# Patient Record
Sex: Male | Born: 1940
Health system: Southern US, Community
[De-identification: ages and names within clinical notes are randomized; demographics above are authoritative.]

## PROBLEM LIST (undated history)

## (undated) DIAGNOSIS — L989 Disorder of the skin and subcutaneous tissue, unspecified: Secondary | ICD-10-CM

## (undated) DIAGNOSIS — M5430 Sciatica, unspecified side: Secondary | ICD-10-CM

## (undated) DIAGNOSIS — I10 Essential (primary) hypertension: Secondary | ICD-10-CM

## (undated) DIAGNOSIS — F419 Anxiety disorder, unspecified: Secondary | ICD-10-CM

## (undated) DIAGNOSIS — I739 Peripheral vascular disease, unspecified: Secondary | ICD-10-CM

## (undated) DIAGNOSIS — R011 Cardiac murmur, unspecified: Secondary | ICD-10-CM

## (undated) DIAGNOSIS — I6529 Occlusion and stenosis of unspecified carotid artery: Secondary | ICD-10-CM

## (undated) DIAGNOSIS — H9190 Unspecified hearing loss, unspecified ear: Secondary | ICD-10-CM

## (undated) DIAGNOSIS — K219 Gastro-esophageal reflux disease without esophagitis: Secondary | ICD-10-CM

## (undated) DIAGNOSIS — M1990 Unspecified osteoarthritis, unspecified site: Secondary | ICD-10-CM

## (undated) DIAGNOSIS — I350 Nonrheumatic aortic (valve) stenosis: Secondary | ICD-10-CM

## (undated) DIAGNOSIS — J449 Chronic obstructive pulmonary disease, unspecified: Secondary | ICD-10-CM

## (undated) DIAGNOSIS — R1900 Intra-abdominal and pelvic swelling, mass and lump, unspecified site: Secondary | ICD-10-CM

## (undated) DIAGNOSIS — L309 Dermatitis, unspecified: Secondary | ICD-10-CM

## (undated) HISTORY — DX: Unspecified hearing loss, unspecified ear: H91.90

## (undated) HISTORY — DX: Nonrheumatic aortic (valve) stenosis: I35.0

## (undated) HISTORY — DX: Occlusion and stenosis of unspecified carotid artery: I65.29

## (undated) HISTORY — DX: Chronic obstructive pulmonary disease, unspecified: J44.9

## (undated) HISTORY — DX: Cardiac murmur, unspecified: R01.1

## (undated) HISTORY — DX: Disorder of the skin and subcutaneous tissue, unspecified: L98.9

## (undated) HISTORY — DX: Sciatica, unspecified side: M54.30

## (undated) HISTORY — DX: Peripheral vascular disease, unspecified: I73.9

## (undated) HISTORY — DX: Dermatitis, unspecified: L30.9

---

## 1994-04-14 HISTORY — PX: OTHER SURGICAL HISTORY: SHX169

## 2013-03-17 ENCOUNTER — Emergency Department (HOSPITAL_COMMUNITY)
Admission: EM | Admit: 2013-03-17 | Discharge: 2013-03-17 | Disposition: A | Discharge status: Home / Self Care | Payer: Medicare Other | Attending: Emergency Medicine | Admitting: Emergency Medicine

## 2013-03-17 ENCOUNTER — Encounter (HOSPITAL_COMMUNITY): Payer: Self-pay | Admitting: Emergency Medicine

## 2013-03-17 ENCOUNTER — Emergency Department (HOSPITAL_COMMUNITY): Payer: Medicare Other

## 2013-03-17 DIAGNOSIS — M129 Arthropathy, unspecified: Secondary | ICD-10-CM | POA: Insufficient documentation

## 2013-03-17 DIAGNOSIS — W010XXA Fall on same level from slipping, tripping and stumbling without subsequent striking against object, initial encounter: Secondary | ICD-10-CM | POA: Insufficient documentation

## 2013-03-17 DIAGNOSIS — S42302A Unspecified fracture of shaft of humerus, left arm, initial encounter for closed fracture: Secondary | ICD-10-CM

## 2013-03-17 DIAGNOSIS — S42209A Unspecified fracture of upper end of unspecified humerus, initial encounter for closed fracture: Secondary | ICD-10-CM | POA: Insufficient documentation

## 2013-03-17 DIAGNOSIS — Y939 Activity, unspecified: Secondary | ICD-10-CM | POA: Insufficient documentation

## 2013-03-17 DIAGNOSIS — F17200 Nicotine dependence, unspecified, uncomplicated: Secondary | ICD-10-CM | POA: Insufficient documentation

## 2013-03-17 DIAGNOSIS — Y929 Unspecified place or not applicable: Secondary | ICD-10-CM | POA: Insufficient documentation

## 2013-03-17 DIAGNOSIS — I10 Essential (primary) hypertension: Secondary | ICD-10-CM | POA: Insufficient documentation

## 2013-03-17 DIAGNOSIS — Z8719 Personal history of other diseases of the digestive system: Secondary | ICD-10-CM | POA: Insufficient documentation

## 2013-03-17 DIAGNOSIS — Z8659 Personal history of other mental and behavioral disorders: Secondary | ICD-10-CM | POA: Insufficient documentation

## 2013-03-17 HISTORY — DX: Gastro-esophageal reflux disease without esophagitis: K21.9

## 2013-03-17 HISTORY — DX: Anxiety disorder, unspecified: F41.9

## 2013-03-17 HISTORY — DX: Unspecified osteoarthritis, unspecified site: M19.90

## 2013-03-17 HISTORY — DX: Essential (primary) hypertension: I10

## 2013-03-17 MED ORDER — OXYCODONE-ACETAMINOPHEN 5-325 MG PO TABS: 1.0000 | ORAL_TABLET | ORAL | Status: DC | PRN

## 2013-03-17 MED ORDER — ONDANSETRON HCL 4 MG PO TABS: 4.0000 mg | ORAL_TABLET | Freq: Four times a day (QID) | ORAL | Status: DC

## 2013-03-17 NOTE — ED Provider Notes (Signed)
I saw and evaluated the patient, reviewed the resident's note and I agree with the findings and plan.  EKG Interpretation   None       Pt with mechanical fall and obvious injury with ecchymosis and swelling to the left proximal humerus.  Normal elbow and wrist and N/V intact.  Sling placed and given pain meds  Gwyneth Sprout, MD 03/17/13 2136

## 2013-03-17 NOTE — ED Notes (Addendum)
Pt states he tripped on embankment and fell on his L shoulder.  Swelling noted to L shoulder.  Pt states some numbness to L upper arm.  Peripheral pulses present.  Pt also feels some R chest discomfort that increases with movement.

## 2013-03-17 NOTE — ED Notes (Signed)
Patient to xray at this time

## 2013-03-17 NOTE — ED Provider Notes (Signed)
CSN: [id number]     Arrival date & time 03/17/13  7829 History   First MD Initiated Contact with Patient 03/17/13 1845     Chief Complaint  Patient presents with  . Shoulder Pain   (Consider location/radiation/quality/duration/timing/severity/associated sxs/prior Treatment) HPI Comments: 72 year old male status Pierrette Scheu fall. Patient reports a mechanical fall falling onto the shoulder. Patient denies striking head has no other injuries outside of left shoulder pain. Patient reports left shoulder pain which is aching pain. It is moderate in severity. It is constant since injury. Injury occurred one hour prior to presentation. It is unchanged. No treatments tried so far. Denies any numbness, tingling or motor loss.  Patient is a 72 y.o. male presenting with shoulder injury.  Shoulder Injury This is a new problem. The current episode started today. The problem occurs constantly. The problem has been unchanged. Associated symptoms include arthralgias. Pertinent negatives include no abdominal pain, chest pain, fatigue, headaches, numbness, rash or weakness. Exacerbated by: movement. He has tried nothing for the symptoms.    Past Medical History  Diagnosis Date  . Hypertension   . Arthritis   . GERD (gastroesophageal reflux disease)   . Anxiety    History reviewed. No pertinent past surgical history. No family history on file. History  Substance Use Topics  . Smoking status: Current Every Day Smoker -- 1.00 packs/day  . Smokeless tobacco: Not on file  . Alcohol Use: Yes     Comment: occ    Review of Systems  Constitutional: Negative for fatigue.  Respiratory: Negative for shortness of breath.   Cardiovascular: Negative for chest pain.  Gastrointestinal: Negative for abdominal pain.  Genitourinary: Negative for dysuria. Testicular pain: left shoulder.  Musculoskeletal: Positive for arthralgias. Negative for back pain.  Skin: Negative for rash.  Neurological: Negative for weakness,  numbness and headaches.  Psychiatric/Behavioral: Negative for agitation.  All other systems reviewed and are negative.    Allergies  Codeine  Home Medications   Current Outpatient Rx  Name  Route  Sig  Dispense  Refill  . ondansetron (ZOFRAN) 4 MG tablet   Oral   Take 1 tablet (4 mg total) by mouth every 6 (six) hours.   12 tablet   0   . oxyCODONE-acetaminophen (PERCOCET/ROXICET) 5-325 MG per tablet   Oral   Take 1 tablet by mouth every 4 (four) hours as needed for severe pain.   20 tablet   0    BP 147/76  Pulse 75  Temp(Src) 98.3 F (36.8 C) (Oral)  Resp 18  SpO2 98% Physical Exam  Nursing note and vitals reviewed. Constitutional: He is oriented to person, place, and time. He appears well-developed and well-nourished.  HENT:  Head: Normocephalic and atraumatic.  No visible signs of trauma or injuries to scalp.  Eyes: EOM are normal. Pupils are equal, round, and reactive to light.  Neck: Normal range of motion.  Cardiovascular: Normal rate, regular rhythm and intact distal pulses.   Pulmonary/Chest: Effort normal and breath sounds normal. No respiratory distress. He exhibits no tenderness.  Abdominal: Soft. He exhibits no distension. There is no tenderness. There is no rebound and no guarding.  Musculoskeletal: Normal range of motion.  Patient with severe tenderness to manipulation of left shoulder. Neurovascularly intact distal to injury. No visible deformities.  Neurological: He is alert and oriented to person, place, and time. No cranial nerve deficit. He exhibits normal muscle tone. Coordination normal.  Skin: Skin is warm and dry. No rash noted.  Psychiatric: He  has a normal mood and affect. His behavior is normal. Judgment and thought content normal.    ED Course  Procedures (including critical care time) Labs Review Labs Reviewed - No data to display Imaging Review Dg Chest 2 View  03/17/2013   CLINICAL DATA:  Fall.  Left chest pain.  Left humerus  fracture.  EXAM: CHEST  2 VIEW  COMPARISON:  03/21/2010  FINDINGS: Heart size remains within normal limits. Mild ectasia of thoracic aorta is stable. Both lungs are clear. No evidence of pneumothorax or hemothorax. No mass or lymphadenopathy identified. Thoracic spine degenerative changes noted.  IMPRESSION: No active disease.   Electronically Signed   By: Myles Rosenthal M.D.   On: 03/17/2013 19:19   Dg Shoulder Left  03/17/2013   CLINICAL DATA:  Status Brien Lowe fall with shoulder pain  EXAM: LEFT SHOULDER - 2+ VIEW  COMPARISON:  None  FINDINGS: There is comminuted displaced fracture of the left humeral head and neck. The visualized left ribs and left lung are normal.  IMPRESSION: Fracture proximal left humerus.   Electronically Signed   By: Sherian Rein M.D.   On: 03/17/2013 19:17    EKG Interpretation   None       MDM   1. Humeral fracture, left, closed, initial encounter    Afebrile vital signs stable on arrival. Patient was a mechanical fall onto left shoulder. Patient denies any syncope, chest pain, shortness of breath et Karie Soda. Strictly mechanical fall. On thorough examination patient with tenderness palpation only over left shoulder. Patient is neurovascularly intact distal to injury. No other injuries noted. X-ray of left shoulder shows a comminuted displaced fracture left humeral head and neck. Patient was placed in a shoulder sling/immobilizer. Contact orthopedics who agrees outpatient management and followup in 4 days appropriate. Patient given a small prescription for pain medication. Given return precautions for worsening pain numbness, tingling etc. patient voiced understanding. No further acute issues until discharge.    Bridgett Larsson, MD 03/17/13 [phone]

## 2013-03-22 ENCOUNTER — Other Ambulatory Visit: Payer: Self-pay | Admitting: Sports Medicine

## 2013-03-22 ENCOUNTER — Ambulatory Visit
Admission: RE | Admit: 2013-03-22 | Discharge: 2013-03-22 | Disposition: A | Discharge status: Home / Self Care | Payer: Medicare Other | Source: Ambulatory Visit | Attending: Sports Medicine | Admitting: Sports Medicine

## 2013-03-22 DIAGNOSIS — S42309A Unspecified fracture of shaft of humerus, unspecified arm, initial encounter for closed fracture: Secondary | ICD-10-CM

## 2013-12-20 ENCOUNTER — Other Ambulatory Visit: Payer: Self-pay | Admitting: Family Medicine

## 2013-12-20 DIAGNOSIS — R1900 Intra-abdominal and pelvic swelling, mass and lump, unspecified site: Secondary | ICD-10-CM

## 2014-01-12 ENCOUNTER — Encounter: Payer: Self-pay | Admitting: General Surgery

## 2014-01-12 DIAGNOSIS — E785 Hyperlipidemia, unspecified: Secondary | ICD-10-CM | POA: Insufficient documentation

## 2014-01-12 DIAGNOSIS — E7800 Pure hypercholesterolemia, unspecified: Secondary | ICD-10-CM

## 2014-01-12 DIAGNOSIS — I10 Essential (primary) hypertension: Secondary | ICD-10-CM

## 2014-01-12 DIAGNOSIS — I359 Nonrheumatic aortic valve disorder, unspecified: Secondary | ICD-10-CM

## 2014-01-17 ENCOUNTER — Ambulatory Visit
Admission: RE | Admit: 2014-01-17 | Discharge: 2014-01-17 | Disposition: A | Discharge status: Home / Self Care | Payer: Medicare Other | Source: Ambulatory Visit | Attending: Family Medicine | Admitting: Family Medicine

## 2014-01-17 DIAGNOSIS — R1900 Intra-abdominal and pelvic swelling, mass and lump, unspecified site: Secondary | ICD-10-CM

## 2014-01-17 MED ORDER — IOHEXOL 300 MG/ML  SOLN
100.0000 mL | Freq: Once | INTRAMUSCULAR | Status: AC | PRN
  Administered 2014-01-17: 100 mL via INTRAVENOUS

## 2014-01-25 ENCOUNTER — Ambulatory Visit (HOSPITAL_COMMUNITY): Payer: Medicare Other | Attending: Cardiovascular Disease

## 2014-01-25 ENCOUNTER — Other Ambulatory Visit (HOSPITAL_COMMUNITY): Payer: Self-pay | Admitting: Family Medicine

## 2014-01-25 DIAGNOSIS — Z720 Tobacco use: Secondary | ICD-10-CM | POA: Diagnosis not present

## 2014-01-25 DIAGNOSIS — I359 Nonrheumatic aortic valve disorder, unspecified: Secondary | ICD-10-CM

## 2014-01-25 DIAGNOSIS — M1990 Unspecified osteoarthritis, unspecified site: Secondary | ICD-10-CM | POA: Diagnosis not present

## 2014-01-25 DIAGNOSIS — K219 Gastro-esophageal reflux disease without esophagitis: Secondary | ICD-10-CM | POA: Diagnosis not present

## 2014-01-25 DIAGNOSIS — F419 Anxiety disorder, unspecified: Secondary | ICD-10-CM | POA: Insufficient documentation

## 2014-01-25 DIAGNOSIS — I10 Essential (primary) hypertension: Secondary | ICD-10-CM | POA: Diagnosis not present

## 2014-01-25 NOTE — Progress Notes (Signed)
2D Echo completed. 01/25/2014

## 2014-02-01 ENCOUNTER — Telehealth (INDEPENDENT_AMBULATORY_CARE_PROVIDER_SITE_OTHER): Payer: Self-pay

## 2014-02-01 ENCOUNTER — Other Ambulatory Visit (INDEPENDENT_AMBULATORY_CARE_PROVIDER_SITE_OTHER): Payer: Self-pay | Admitting: Surgery

## 2014-02-01 DIAGNOSIS — E278 Other specified disorders of adrenal gland: Secondary | ICD-10-CM

## 2014-02-01 NOTE — Progress Notes (Signed)
No info on this patient. Will he be seeing me as an OP?

## 2014-02-01 NOTE — Telephone Encounter (Signed)
Pt seen in office today by Dr Johney Maine and orders placed epic for labs to work up a left adrenal mass.

## 2014-02-01 NOTE — H&P (Signed)
Haskell Flirt Bogan 02/01/2014 11:01 AM Location: [facility] Patient #: [id number] DOB: January 23, 1941 Single / Language: Jacob Christian / Race: White Male  History of Present Illness Jacob Hector MD; 02/01/2014 3:38 PM) Patient words: new Pt. possible anal mass.  The patient is a 73 year old male who presents wtih an adrenal mass. Pleasant patient. Comes today with his daughter. Tends to avoid doctors. Heavy smoker. History of falls with chronic left shoulder and chest wall pain. Some arthritis with right knee pain. Moderately active. Had some discomfort. Mass felt in abdomen by Dr Alroy Dust on exam. Underwent CT scan. Large mass found on left kidney suspicious for adrenal tumor. Surgical consultation requested. He denies much abdominal pain. Has bowel movements every day. Never had a colonoscopy. Continues to smoke. Been told to consider colonoscopy in quit smoking. Still thinking about it. Daughter thinks he has some mild shortness of breath with activity but not severe. The patient was referred by a primary care provider. Initial presentation was 3 week(s) ago. Presentation included pain. Past evaluation has included CT. Symptoms include asymptomatic. Symptoms are located in the left adrenal. The mass is described as smooth. Onset was sudden. The patient is not currently being treated for this problem. Risk factors do not include impaired immunity, radiation for other malignancy or environmental radiation exposure. Pertinent family history does not include autoimmune disease, familial adenomatous polyposis, multiple endocrine neoplasia type 2 or pheochromocytoma.   Other Problems Briant Cedar, CMA; 02/01/2014 11:01 AM) Anxiety Disorder Arthritis Gastroesophageal Reflux Disease High blood pressure  Past Surgical History Briant Cedar, CMA; 02/01/2014 11:01 AM) Knee Surgery Right.  Diagnostic Studies History Jacob Hector, MD; 02/01/2014 3:38  PM) Colonoscopy never Echocardiogram10/14/2015 Transthoracic Echocardiography Patient: Jacob Christian, Jacob Christian MR #: [id number] Study Date: 01/25/2014 Gender: M Age: 43 Height: 180.3 cm Weight: 81.2 kg BSA: 2.02 m^2 Pt. Status: Room: ATTENDING Mertie Moores, M.D. SONOGRAPHER [place], RDCS ORDERING Donnie Coffin Garnette Czech, Dean PERFORMING [facility], Outpatient cc: ------------------------------------------------------------------- LV EF: 60% - 65% ------------------------------------------------------------------- Indications: Aortic valve disease (I35.9). ------------------------------------------------------------------- History: Risk factors: Arthritis. GERD. Anxiety. Osteoarthritis. Current tobacco use. Hypertension. ------------------------------------------------------------------- Study Conclusions - Left ventricle: The cavity size was normal. Wall thickness was increased in a pattern of mild LVH. Systolic function was normal. The estimated ejection fraction was in the range of 60% to 65%. Wall motion was normal; there were no regional wall motion abnormalities. Doppler parameters are consistent with abnormal left ventricular relaxation (grade 1 diastolic dysfunction). - Aortic valve: Mildly calcified annulus. Moderately thickened, moderately calcified leaflets. There was mild to moderate stenosis. Valve area (VTI): 1.12 cm^2. Valve area (Vmean): 0.97 cm^2. - Left atrium: The atrium was mildly to moderately dilated. - Right atrium: The atrium was mildly to moderately dilated. - Pulmonary arteries: Systolic pressure was mildly increased. PA peak pressure: 35 mm Hg (S).  Allergies Briant Cedar, CMA; 02/01/2014 11:03 AM) Codeine/Codeine Derivatives  Medication History Briant Cedar, CMA; 02/01/2014 11:04 AM) AmLODIPine Besylate (5MG  Tablet, Oral) Active. Lisinopril (40MG  Tablet, Oral) Active. Pantoprazole Sodium (40MG  Tablet DR, Oral) Active. Etodolac (400MG  Tablet, Oral)  Active. Hydrocodone-Acetaminophen (5-325MG  Tablet, Oral) Active.  Social History Briant Cedar, [place]; 02/01/2014 11:01 AM) Alcohol use Occasional alcohol use. Caffeine use Carbonated beverages, Coffee, Tea. No drug use Tobacco use Current every day smoker.  Family History Briant Cedar, [place]; 02/01/2014 11:01 AM) Cancer Mother. Heart Disease Father. Hypertension Father. Prostate Cancer Brother.  Review of Systems Briant Cedar CMA; 02/01/2014 11:01 AM) General Not Present- Appetite Loss, Chills, Fatigue, Fever, Night  Sweats, Weight Gain and Weight Loss. Skin Not Present- Change in Wart/Mole, Dryness, Hives, Jaundice, New Lesions, Non-Healing Wounds, Rash and Ulcer. HEENT Present- Hearing Loss and Wears glasses/contact lenses. Not Present- Earache, Hoarseness, Nose Bleed, Oral Ulcers, Ringing in the Ears, Seasonal Allergies, Sinus Pain, Sore Throat, Visual Disturbances and Yellow Eyes. Cardiovascular Present- Leg Cramps and Shortness of Breath. Not Present- Chest Pain, Difficulty Breathing Lying Down, Palpitations, Rapid Heart Rate and Swelling of Extremities. Gastrointestinal Not Present- Abdominal Pain, Bloating, Bloody Stool, Change in Bowel Habits, Chronic diarrhea, Constipation, Difficulty Swallowing, Excessive gas, Gets full quickly at meals, Hemorrhoids, Indigestion, Nausea, Rectal Pain and Vomiting. Male Genitourinary Not Present- Blood in Urine, Change in Urinary Stream, Frequency, Impotence, Nocturia, Painful Urination, Urgency and Urine Leakage. Neurological Not Present- Decreased Memory, Fainting, Headaches, Numbness, Seizures, Tingling, Tremor, Trouble walking and Weakness. Psychiatric Present- Anxiety. Not Present- Bipolar, Change in Sleep Pattern, Depression, Fearful and Frequent crying. Endocrine Not Present- Cold Intolerance, Excessive Hunger, Hair Changes, Heat Intolerance, Hot flashes and New Diabetes. Hematology Not Present- Easy Bruising, Excessive  bleeding, Gland problems, HIV and Persistent Infections.   Vitals Briant Cedar CMA; 02/01/2014 11:05 AM) 02/01/2014 11:04 AM Weight: 181 lb Height: 69in Body Surface Area: 2 m Body Mass Index: 26.73 kg/m Temp.: 97.82F  Pulse: 63 (Regular)  BP: 160/80 (Sitting, Left Arm, Standard)    Physical Exam Jacob Hector MD; 02/01/2014 11:55 AM) General Mental Status-Alert. General Appearance-Not in acute distress, Not Sickly. Orientation-Oriented X3. Hydration-Well hydrated. Voice-Normal.  Integumentary Global Assessment Upon inspection and palpation of skin surfaces of the - Axillae: non-tender, no inflammation or ulceration, no drainage. and Distribution of scalp and body hair is normal. General Characteristics Temperature - normal warmth is noted.  Head and Neck Head-normocephalic, atraumatic with no lesions or palpable masses. Face Global Assessment - atraumatic, no absence of expression. Neck Global Assessment - no abnormal movements, no bruit auscultated on the right, no bruit auscultated on the left, no decreased range of motion, non-tender. Trachea-midline. Thyroid Gland Characteristics - non-tender.  Eye Eyeball - Left-Extraocular movements intact, No Nystagmus. Eyeball - Right-Extraocular movements intact, No Nystagmus. Cornea - Left-No Hazy. Cornea - Right-No Hazy. Sclera/Conjunctiva - Left-No scleral icterus, No Discharge. Sclera/Conjunctiva - Right-No scleral icterus, No Discharge. Pupil - Left-Direct reaction to light normal. Pupil - Right-Direct reaction to light normal.  ENMT Ears Pinna - Left - no drainage observed, no generalized tenderness observed. Right - no drainage observed, no generalized tenderness observed. Nose and Sinuses External Inspection of the Nose - no destructive lesion observed. Inspection of the nares - Left - quiet respiration. Right - quiet respiration. Mouth and Throat Lips - Upper Lip  - no fissures observed, no pallor noted. Lower Lip - no fissures observed, no pallor noted. Nasopharynx - no discharge present. Oral Cavity/Oropharynx - Tongue - no dryness observed. Oral Mucosa - no cyanosis observed. Hypopharynx - no evidence of airway distress observed.  Chest and Lung Exam Inspection Movements - Normal and Symmetrical. Accessory muscles - No use of accessory muscles in breathing. Palpation Palpation of the chest reveals - Non-tender. Auscultation Breath sounds - Normal and Clear.  Cardiovascular Auscultation Rhythm - Regular. Murmurs & Other Heart Sounds - Auscultation of the heart reveals - No Murmurs and No Systolic Clicks.  Abdomen Inspection Inspection of the abdomen reveals - No Visible peristalsis and No Abnormal pulsations. Umbilicus - No Bleeding, No Urine drainage. Palpation/Percussion Palpation and Percussion of the abdomen reveal - Soft, Non Tender, No Rebound tenderness, No Rigidity (guarding) and  No Cutaneous hyperesthesia. Note: Abdomen soft and flat. No umbilical hernia. Obvious left upper quadrant spherical mobile mass about the size of a grapefruit. Not fluctuant. The patient and daughter feel it as well.   Male Genitourinary Sexual Maturity Tanner 5 - Adult hair pattern and Adult penile size and shape.  Peripheral Vascular Upper Extremity Inspection - Left - No Cyanotic nailbeds, Not Ischemic. Right - No Cyanotic nailbeds, Not Ischemic.  Neurologic Neurologic evaluation reveals -normal attention span and ability to concentrate, able to name objects and repeat phrases. Appropriate fund of knowledge , normal sensation and normal coordination. Mental Status Affect - not angry, not paranoid. Cranial Nerves-Normal Bilaterally. Gait-Normal.  Neuropsychiatric Mental status exam performed with findings of-able to articulate well with normal speech/language, rate, volume and coherence, thought content normal with ability to perform basic  computations and apply abstract reasoning and no evidence of hallucinations, delusions, obsessions or homicidal/suicidal ideation.  Musculoskeletal Global Assessment Spine, Ribs and Pelvis - no instability, subluxation or laxity. Right Upper Extremity - no instability, subluxation or laxity.  Lymphatic Head & Neck  General Head & Neck Lymphatics: Bilateral - Description - No Localized lymphadenopathy. Axillary  General Axillary Region: Bilateral - Description - No Localized lymphadenopathy. Femoral & Inguinal  Generalized Femoral & Inguinal Lymphatics: Left - Description - No Localized lymphadenopathy. Right - Description - No Localized lymphadenopathy.    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 It is mostly mobile and smooth so hopefully likelihood less. This will require surgical resection. Would plan hand-assisted given large size with low threshold to convert to open. Discussed with my surgical partners (Rosenbower, Gerkin)as well.  The anatomy and physiology of the adrenal gland was discussed. Pathophysiology of the adrenal problem was discussed. Options were discussed, and I made a recommendation to remove the adrenal gland & to treat the pathology. Minimally invasive & open techniques discussed.  Risks of bleeding, infection, injury to other organs, reoperation, death, and other risks were discussed. I noted a good likelihood this will help address the problem. While there are risks, I feel the risks of nonoperative management are greater; therefore, I feel surgery offers the best option. Educational material was available. We will work to minimize complications.  Obtain plasma and urinary studies to make sure is not a hormone producing tumor, especially a pheochromocytoma. He is only on 2 blood pressure medications which control his hypertension well so I doubt  that.  He will require cardiac clearance. Sees Dr. Daneen Schick.  I strongly encouraged him to quit smoking as well. Current Plans  Instructions:  STOP SMOKING!  We strongly recommend that you stop smoking. Smoking increases the risk of surgery including infection in the form of an open wound, pus formation, abscess, hernia at an incision on the abdomen, etc. You have an increased risk of other MAJOR complications such as stroke, heart attack, forming clots in the leg and/or lungs, and death.  Smoking Cessation Quitting smoking is important to your health and has many advantages. However, it is not always easy to quit since nicotine is a very addictive drug. Often times, people try 3 times or more before being able to quit. This document explains the best ways for you to prepare to quit smoking. Quitting takes hard work and a lot of effort, but you can do it. ADVANTAGES OF QUITTING SMOKING  You will live longer, feel better, and live better.  Your body will feel the impact of quitting smoking almost immediately.  Within 20 minutes, blood pressure decreases. Your pulse returns to its normal level.  After 8 hours, carbon monoxide levels in the blood return to normal. Your oxygen level increases.  After 24 hours, the chance of having a heart attack starts to decrease. Your breath, hair, and body stop smelling like smoke.  After 48 hours, damaged nerve endings begin to recover. Your sense of taste and smell improve.  After 72 hours, the body is virtually free of nicotine. Your bronchial tubes relax and breathing becomes easier.  After 2 to 12 weeks, lungs can hold more air. Exercise becomes easier and circulation improves.  The risk of having a heart attack, stroke, cancer, or lung disease is greatly reduced.  After 1 year, the risk of coronary heart disease is cut in half.  After 5 years, the risk of stroke falls to the same as a nonsmoker.  After 10 years, the risk of lung cancer  is cut in half and the risk of other cancers decreases significantly.  After 15 years, the risk of coronary heart disease drops, usually to the level of a nonsmoker.  If you are pregnant, quitting smoking will improve your chances of having a healthy baby.  The people you live with, especially any children, will be healthier.  You will have extra money to spend on things other than cigarettes. QUESTIONS TO THINK ABOUT BEFORE ATTEMPTING TO QUIT You may want to talk about your answers with your caregiver.  Why do you want to quit?  If you tried to quit in the past, what helped and what did not?  What will be the most difficult situations for you after you quit? How will you plan to handle them?  Who can help you through the tough times? Your family? Friends? A caregiver?  What pleasures do you get from smoking? What ways can you still get pleasure if you quit? Here are some questions to ask your caregiver:  How can you help me to be successful at quitting?  What medicine do you think would be best for me and how should I take it?  What should I do if I need more help?  What is smoking withdrawal like? How can I get information on withdrawal? GET READY  Set a quit date.  Change your environment by getting rid of all cigarettes, ashtrays, matches, and lighters in your home, car, or work. Do not let people smoke in your home.  Review your past attempts to quit. Think about what worked and what did not. GET SUPPORT AND ENCOURAGEMENT You have a better chance of being successful if you have help. You can get support in many ways.  Tell your family, friends, and co-workers that you are going to quit and need their support. Ask them not to smoke around you.  Get individual, group, or telephone counseling and support. Programs are available at [employer] and health centers. Call your local health department for information about programs in your area.  Spiritual beliefs and  practices may help some smokers quit.  Download a "quit meter" on your computer to keep track of quit statistics, such as how long you have gone without smoking, cigarettes not smoked, and money saved.  Get a self-help book about quitting smoking and staying off of tobacco. Holl[place] yourself from urges to smoke. Talk to someone, go for a walk, or occupy your time with  a task.  Change your normal routine. Take a different route to work. Drink tea instead of coffee. Eat breakfast in a different place.  Reduce your stress. Take a hot bath, exercise, or read a book.  Plan something enjoyable to do every day. Reward yourself for not smoking.  Explore interactive web-based programs that specialize in helping you quit. GET MEDICINE AND USE IT CORRECTLY Medicines can help you stop smoking and decrease the urge to smoke. Combining medicine with the above behavioral methods and support can greatly increase your chances of successfully quitting smoking.  Nicotine replacement therapy helps deliver nicotine to your body without the negative effects and risks of smoking. Nicotine replacement therapy includes nicotine gum, lozenges, inhalers, nasal sprays, and skin patches. Some may be available over-the-counter and others require a prescription.  Antidepressant medicine helps people abstain from smoking, but how this works is unknown. This medicine is available by prescription.  Nicotinic receptor partial agonist medicine simulates the effect of nicotine in your brain. This medicine is available by prescription. Ask your caregiver for advice about which medicines to use and how to use them based on your health history. Your caregiver will tell you what side effects to look out for if you choose to be on a medicine or therapy. Carefully read the information on the package. Do not use any other product containing nicotine while using a nicotine replacement product. RELAPSE OR  DIFFICULT SITUATIONS Most relapses occur within the first 3 months after quitting. Do not be discouraged if you start smoking again. Remember, most people try several times before finally quitting. You may have symptoms of withdrawal because your body is used to nicotine. You may crave cigarettes, be irritable, feel very hungry, cough often, get headaches, or have difficulty concentrating. The withdrawal symptoms are only temporary. They are strongest when you first quit, but they will go away within 10 14 days. To reduce the chances of relapse, try to:  Avoid drinking alcohol. Drinking lowers your chances of successfully quitting.  Reduce the amount of caffeine you consume. Once you quit smoking, the amount of caffeine in your body increases and can give you symptoms, such as a rapid heartbeat, sweating, and anxiety.  Avoid smokers because they can make you want to smoke.  Do not let weight gain distract you. Many smokers will gain weight when they quit, usually less than 10 pounds. Eat a healthy diet and stay active. You can always lose the weight gained after you quit.  Find ways to improve your mood other than smoking. FOR MORE INFORMATION www.smokefree.gov   While it can be one of the most difficult things to do, the [place] community has programs to help you stop. Consider talking with your primary care physician about options. Also, Smoking Cessation classes are available through the [facility]:  The smoking cessation program is a proven-effective program from the American Lung Association. The program is available for anyone 24 and older who currently smokes. The program lasts for 7 weeks and is 8 sessions. Each class will be approximately 1 1/2 hours. The program is every Tuesday. All classes are 12-1:30pm and same location.  Event Location Information: Location: [place] 2nd Floor Conference Room 2-037; located next to [facility] cross streets:  [place] Entrance into the [facility] is adjacent to the [employer] main entrance. The conference room is located on the 2nd floor. Parking Instructions: Visitor parking is  adjacent to [employer] main entrance and the [place]   A smoking cessation program is also offered through the [facility]. Register online at ClickDebate.gl or call [phone] for more information. Tobacco cessation counseling is available at [facility]. Call [phone] for a free appointment. Tobacco cessation classes also are available through the [facility] in [place]. For information, call [phone]. The Patient Education Network features videos on tobacco cessation. Please consult your listings in the center of this book to find instructions on how to access this resource. If you want more information, ask your nurse.     Instructions:  You have a mass growing in your left adrenal gland on top of your kidney. He needs to be removed. Risk of cancer.  We will have her cardiologist help clear you.  Continue to walk an hour a day to improve exercise tolerance.  Consider stopping smoking.  We are going to order blood and urine studies to better evaluate this mass.  Consider colonoscopy at some point to rule out any colon polyps or cancer   Signed by Jacob Hector, MD (02/01/2014 3:40 PM)

## 2014-02-02 NOTE — Progress Notes (Signed)
I just saw him yesterday.  Hopefully Cardiology appointment can be set up soon.  I believe Dr. Alroy Dust did the echocardiogram in anticipation of cardiac clearance given his heavy smoking & OK exercise tolerance

## 2014-02-03 ENCOUNTER — Telehealth: Payer: Self-pay | Admitting: Interventional Cardiology

## 2014-02-03 NOTE — Telephone Encounter (Signed)
We need to get the patient into the office so that we can get him cleared for drainable surgery.

## 2014-02-03 NOTE — Progress Notes (Signed)
I made pt an appt for 02/08/14 with Dr. Johnsie Cancel. Spoke to pt and confirmed appt 02/03/14.

## 2014-02-08 ENCOUNTER — Encounter: Payer: Self-pay | Admitting: Cardiovascular Disease

## 2014-02-08 ENCOUNTER — Ambulatory Visit (INDEPENDENT_AMBULATORY_CARE_PROVIDER_SITE_OTHER): Payer: Medicare Other | Admitting: Cardiovascular Disease

## 2014-02-08 VITALS — BP 140/67 | HR 70 | Ht 71.0 in | Wt 179.0 lb

## 2014-02-08 DIAGNOSIS — I359 Nonrheumatic aortic valve disorder, unspecified: Secondary | ICD-10-CM

## 2014-02-08 DIAGNOSIS — Z01810 Encounter for preprocedural cardiovascular examination: Secondary | ICD-10-CM

## 2014-02-08 DIAGNOSIS — R0989 Other specified symptoms and signs involving the circulatory and respiratory systems: Secondary | ICD-10-CM

## 2014-02-08 DIAGNOSIS — Z01818 Encounter for other preprocedural examination: Secondary | ICD-10-CM

## 2014-02-08 DIAGNOSIS — E7800 Pure hypercholesterolemia, unspecified: Secondary | ICD-10-CM

## 2014-02-08 DIAGNOSIS — R0600 Dyspnea, unspecified: Secondary | ICD-10-CM

## 2014-02-08 NOTE — Patient Instructions (Addendum)
Your physician recommends that you schedule a follow-up appointment in: [place] Your physician recommends that you continue on your current medications as directed. Please refer to the Current Medication list given to you today.  Your physician has requested that you have a carotid duplex. This test is an ultrasound of the carotid arteries in your neck. It looks at blood flow through these arteries that supply the brain with blood. Allow one hour for this exam. There are no restrictions or special instructions.   Your physician has requested that you have a lexiscan myoview. For further information please visit HugeFiesta.tn. Please follow instruction sheet, as given.  Your physician has recommended that you have a pulmonary function test. Pulmonary Function Tests are a group of tests that measure how well air moves in and out of your lungs.

## 2014-02-08 NOTE — Assessment & Plan Note (Signed)
Reviewed echo from Dr Tamala Julian 10/22/10  Moderate LVH mild AS mean gradient 62mmHg peak 24  Compared to recent ECG really no change although murmur sounds more impressive.  F/U echo in a year This should not be an issue for surgery

## 2014-02-08 NOTE — Progress Notes (Addendum)
Patient ID: Jacob Christian, male   DOB: February 08, 1941, 73 y.o.   MRN: [id number]   73 yo referred for cardiac clearance  Referred by Dr Johney Maine has adrenal mass  Echo done for murmur 01/25/14 showed mild to moderate AS Last seen by Dr Tamala Julian 3 years ago who set this appt up.  Patient has known AS apparently had echo 3 years ago and mild.  No history of CAD  But heavy smoker with clinical emphysema.  No PFTs  Still smoking Activity limited by dyspnea.  Some tightness with dyspnea Rx for HTN     Study Conclusions  - Left ventricle: The cavity size was normal. Wall thickness was increased in a pattern of mild LVH. Systolic function was normal. The estimated ejection fraction was in the range of 60% to 65%. Wall motion was normal; there were no regional wall motion abnormalities. Doppler parameters are consistent with abnormal left ventricular relaxation (grade 1 diastolic dysfunction). - Aortic valve: Mildly calcified annulus. Moderately thickened, moderately calcified leaflets. There was mild to moderate stenosis. Valve area (VTI): 1.12 cm^2. Valve area (Vmean): 0.97 cm^2. - Left atrium: The atrium was mildly to moderately dilated. - Right atrium: The atrium was mildly to moderately dilated. - Pulmonary arteries: Systolic pressure was mildly increased. PA peak pressure: 35 mm Hg (S).   Mean aortic gradient 21 mmHg      ROS: Denies fever, malais, weight loss, blurry vision, decreased visual acuity, cough, sputum, SOB, hemoptysis, pleuritic pain, palpitaitons, heartburn, abdominal pain, melena, lower extremity edema, claudication, or rash.  All other systems reviewed and negative   General: Affect appropriate Healthy:  appears stated age 73: normal Neck supple with no adenopathy JVP normal bilateral  bruits no thyromegaly Lungs clear with no wheezing and good diaphragmatic motion Heart:  S1/S2 moderate AS murmur,rub, gallop or click PMI normal Abdomen: benighn, BS positve, no  tenderness, no AAA no bruit.  No HSM or HJR Distal pulses intact with no bruits No edema Neuro non-focal Skin warm and dry No muscular weakness  Medications Current Outpatient Prescriptions  Medication Sig Dispense Refill  . acetaminophen (TYLENOL) 500 MG tablet Take 500 mg by mouth every 6 (six) hours as needed.      . ALPRAZolam (XANAX) 0.5 MG tablet Take 0.5 mg by mouth 3 (three) times daily as needed for anxiety.      Marland Kitchen amLODipine (NORVASC) 5 MG tablet Take 5 mg by mouth daily.      Marland Kitchen etodolac (LODINE) 400 MG tablet Take 400 mg by mouth as needed for mild pain.      . Flaxseed, Linseed, (FLAX SEED OIL) 1000 MG CAPS Take 1 capsule by mouth 2 (two) times a week.      Marland Kitchen lisinopril (PRINIVIL,ZESTRIL) 40 MG tablet Take 40 mg by mouth daily.      . Multiple Vitamin (MULTIVITAMIN WITH MINERALS) TABS tablet Take 1 tablet by mouth daily.      . ondansetron (ZOFRAN) 4 MG tablet Take 1 tablet (4 mg total) by mouth every 6 (six) hours.  12 tablet  0  . oxyCODONE-acetaminophen (PERCOCET/ROXICET) 5-325 MG per tablet Take 1 tablet by mouth every 4 (four) hours as needed for severe pain.  20 tablet  0   No current facility-administered medications for this visit.    Allergies Codeine; Dilaudid; and Vicodin  Family History: Family History  Problem Relation Age of Onset  . Throat cancer Mother   . CVA Father   . Hypertension Father   .  CVA Brother   . Pancreatic cancer Maternal Uncle   . Throat cancer Maternal Grandmother   . Colon cancer Brother     Social History: History   Social History  . Marital Status: Widowed    Spouse Name: N/A    Number of Children: N/A  . Years of Education: N/A   Occupational History  . Not on file.   Social History Main Topics  . Smoking status: Current Every Day Smoker -- 0.50 packs/day  . Smokeless tobacco: Not on file  . Alcohol Use: Yes     Comment: occ  . Drug Use: No     Comment: quit marijuana in 2014  . Sexual Activity: Not on file    Other Topics Concern  . Not on file   Social History Narrative  . No narrative on file    Past Surgical History  Procedure Laterality Date  . Leg fracture repair Left 1996    Past Medical History  Diagnosis Date  . Hypertension   . Arthritis   . GERD (gastroesophageal reflux disease)   . Anxiety   . COPD (chronic obstructive pulmonary disease)   . Peripheral vascular disease   . Sciatica   . Heart murmur     Electrocardiogram:  SR rate 57 LVH   Assessment and Plan

## 2014-02-08 NOTE — Assessment & Plan Note (Signed)
Cholesterol is at goal.  Continue current dose of statin and diet Rx.  No myalgias or side effects.  F/U  LFT's in 6 months. No results found for this basename: [place]  labs with primary target LDL less than 100

## 2014-02-08 NOTE — Assessment & Plan Note (Signed)
High in neck not related to AS murmur  Needs carotid duplex  Also marker vascular disease so needs myovue to clear for surgery

## 2014-02-08 NOTE — Assessment & Plan Note (Signed)
Biggest risk appears to be smoking, COPD and functional limitation from breathing Needs PFTls and to stop smoking  Given risk factors and major surgery with tightness and dyspnea with activity favor stress myovue as will

## 2014-02-14 ENCOUNTER — Ambulatory Visit (HOSPITAL_COMMUNITY): Payer: Medicare Other | Attending: Cardiovascular Disease | Admitting: Radiology

## 2014-02-14 ENCOUNTER — Ambulatory Visit (HOSPITAL_BASED_OUTPATIENT_CLINIC_OR_DEPARTMENT_OTHER): Payer: Medicare Other | Admitting: *Deleted

## 2014-02-14 DIAGNOSIS — R0600 Dyspnea, unspecified: Secondary | ICD-10-CM

## 2014-02-14 DIAGNOSIS — R0789 Other chest pain: Secondary | ICD-10-CM | POA: Diagnosis not present

## 2014-02-14 DIAGNOSIS — I10 Essential (primary) hypertension: Secondary | ICD-10-CM | POA: Insufficient documentation

## 2014-02-14 DIAGNOSIS — Z01810 Encounter for preprocedural cardiovascular examination: Secondary | ICD-10-CM

## 2014-02-14 DIAGNOSIS — R0609 Other forms of dyspnea: Secondary | ICD-10-CM | POA: Diagnosis not present

## 2014-02-14 DIAGNOSIS — J449 Chronic obstructive pulmonary disease, unspecified: Secondary | ICD-10-CM | POA: Insufficient documentation

## 2014-02-14 DIAGNOSIS — R0989 Other specified symptoms and signs involving the circulatory and respiratory systems: Secondary | ICD-10-CM

## 2014-02-14 DIAGNOSIS — Z01818 Encounter for other preprocedural examination: Secondary | ICD-10-CM

## 2014-02-14 DIAGNOSIS — E780 Pure hypercholesterolemia: Secondary | ICD-10-CM

## 2014-02-14 MED ORDER — REGADENOSON 0.4 MG/5ML IV SOLN
0.4000 mg | Freq: Once | INTRAVENOUS | Status: AC
  Administered 2014-02-14: 0.4 mg via INTRAVENOUS

## 2014-02-14 MED ORDER — AMINOPHYLLINE 25 MG/ML IV SOLN
75.0000 mg | Freq: Once | INTRAVENOUS | Status: AC
  Administered 2014-02-14: 75 mg via INTRAVENOUS

## 2014-02-14 MED ORDER — TECHNETIUM TC 99M SESTAMIBI GENERIC - CARDIOLITE
30.0000 | Freq: Once | INTRAVENOUS | Status: AC | PRN
  Administered 2014-02-14: 30 via INTRAVENOUS

## 2014-02-14 MED ORDER — TECHNETIUM TC 99M SESTAMIBI GENERIC - CARDIOLITE
10.0000 | Freq: Once | INTRAVENOUS | Status: AC | PRN
  Administered 2014-02-14: 10 via INTRAVENOUS

## 2014-02-14 NOTE — Progress Notes (Signed)
Carotid Duplex Performed 

## 2014-02-14 NOTE — Progress Notes (Signed)
[facility] [place], [place] [address] [phone]    Cardiology Nuclear Med Study  Jacob Christian is a 73 y.o. male     MRN : [id number]     DOB: 1940/09/14  Procedure Date: 02/14/2014  Nuclear Med Background Indication for Stress Test:  Evaluation for Ischemia and Surgical Clearance: Adrenal Mass with Dr. Johney Maine History:  COPD Cardiac Risk Factors: Carotid Disease and Hypertension  Symptoms:  Chest Tightness and DOE   Nuclear Pre-Procedure Caffeine/Decaff Intake:  9:00pm NPO After: 9:00pm   Lungs:  clear O2 Sat: 99% on room air. IV 0.9% NS with Angio Cath:  22g  IV Site: R Hand  IV Started by:  Matilde Haymaker, RN  Chest Size (in):  42 Cup Size: n/a  Height: 5\' 11"  (1.803 m)  Weight:  178 lb (80.74 kg)  BMI:  Body mass index is 24.84 kg/(m^2). Tech Comments:  n/a    Nuclear Med Study 1 or 2 day study: 1 day  Stress Test Type:  Lexiscan  Reading MD: n/a  Order Authorizing Provider:  Verlin Dike  Resting Radionuclide: Technetium 39m Sestamibi  Resting Radionuclide Dose: 11.0 mCi   Stress Radionuclide:  Technetium 48m Sestamibi  Stress Radionuclide Dose: 33.0 mCi           Stress Protocol Rest HR: 63 Stress HR: 86  Rest BP: 151/69 Stress BP: 171/72  Exercise Time (min): n/a METS: n/a   Predicted Max HR: 149 bpm % Max HR: 57.72 bpm Rate Pressure Product: 14706   Dose of Adenosine (mg):  n/a Dose of Lexiscan: 0.4 mg  Dose of Atropine (mg): n/a Dose of Dobutamine: n/a mcg/kg/min (at max HR)  Stress Test Technologist: Perrin Maltese, EMT-P  Nuclear Technologist:  Margie Ege     Rest Procedure:  Myocardial perfusion imaging was performed at rest 45 minutes following the intravenous administration of Technetium 69m Sestamibi. Rest ECG: NSR - Normal EKG  Stress Procedure:  The patient received IV Lexiscan 0.4 mg over 15-seconds.  Technetium 46m Sestamibi injected at 30-seconds. This patient had sob and  nausea with the Lexiscan injection. Quantitative spect images were obtained after a 45 minute delay. Stress ECG: No significant change from baseline ECG  QPS Raw Data Images:  There is interference from nuclear activity from structures below the diaphragm. This does not affect the ability to read the study. Stress Images:  There is decreased uptake in the inferior wall. Rest Images:  There is decreased uptake in the inferior wall. Subtraction (SDS):  No evidence of ischemia. Transient Ischemic Dilatation (Normal <1.22):  0.98 Lung/Heart Ratio (Normal <0.45):  0.27  Quantitative Gated Spect Images QGS EDV:  184 ml QGS ESV:  102 ml  Impression Exercise Capacity:  Lexiscan with no exercise. BP Response:  Normal blood pressure response. Clinical Symptoms:  No significant symptoms noted. ECG Impression:  No significant ST segment change suggestive of ischemia. Comparison with Prior Nuclear Study: No previous nuclear study performed  Overall Impression:  Low risk stress nuclear study with significant extracardiac activity with decreased uptake in the inferior wall on stress and rest images with normal wall motion. No evidence of ischemia. .  LV Ejection Fraction: 44%.  LV Wall Motion:  The interpretation of the left ventricular systolic function is affected by extracardiac activity and appears better that calculated by the software. No regional wall motion abnormalities.     Dorothy Spark 02/14/2014

## 2014-02-17 ENCOUNTER — Telehealth: Payer: Self-pay | Admitting: *Deleted

## 2014-02-17 NOTE — Telephone Encounter (Signed)
-----   Message from Josue Hector, MD sent at 02/16/2014  3:44 PM EST ----- Myovue stress test low risk  Echo normal EF and only mild to moderate AS  Ok to have surgery

## 2014-02-17 NOTE — Telephone Encounter (Signed)
I spoke with pt & he is aware of his stress myoview & carotid results. He is aware he is cleared for surgery & will have pfts on 03/01/14. Return visit with [name] on 03/24/14.  Results forwarded to Dr. Johney Maine surgeon pt request. Horton Chin RN

## 2014-02-20 ENCOUNTER — Other Ambulatory Visit (INDEPENDENT_AMBULATORY_CARE_PROVIDER_SITE_OTHER): Payer: Self-pay | Admitting: Surgery

## 2014-03-01 ENCOUNTER — Ambulatory Visit (INDEPENDENT_AMBULATORY_CARE_PROVIDER_SITE_OTHER): Payer: Medicare Other | Admitting: Internal Medicine

## 2014-03-01 DIAGNOSIS — R0600 Dyspnea, unspecified: Secondary | ICD-10-CM

## 2014-03-01 LAB — PULMONARY FUNCTION TEST
DL/VA % pred: 81 %
DL/VA: 3.69 ml/min/mmHg/L
DLCO unc % pred: 79 %
DLCO unc: 24.59 ml/min/mmHg
FEF 25-75 Post: 3.53 L/sec
FEF 25-75 Pre: 2.85 L/sec
FEF2575-%Change-Post: 23 %
FEF2575-%Pred-Post: 151 %
FEF2575-%Pred-Pre: 122 %
FEV1-%Change-Post: 6 %
FEV1-%Pred-Post: 117 %
FEV1-%Pred-Pre: 110 %
FEV1-Post: 3.63 L
FEV1-Pre: 3.4 L
FEV1FVC-%Change-Post: 0 %
FEV1FVC-%Pred-Pre: 104 %
FEV6-%Change-Post: 6 %
FEV6-%Pred-Post: 117 %
FEV6-%Pred-Pre: 110 %
FEV6-Post: 4.66 L
FEV6-Pre: 4.37 L
FEV6FVC-%Change-Post: 0 %
FEV6FVC-%Pred-Post: 104 %
FEV6FVC-%Pred-Pre: 105 %
FVC-%Change-Post: 6 %
FVC-%Pred-Post: 112 %
FVC-%Pred-Pre: 105 %
FVC-Post: 4.74 L
FVC-Pre: 4.44 L
Post FEV1/FVC ratio: 77 %
Post FEV6/FVC ratio: 98 %
Pre FEV1/FVC ratio: 76 %
Pre FEV6/FVC Ratio: 99 %
RV % pred: 87 %
RV: 2.11 L
TLC % pred: 103 %
TLC: 7.1 L

## 2014-03-01 NOTE — Progress Notes (Signed)
PFT done today. 

## 2014-03-06 ENCOUNTER — Telehealth: Payer: Self-pay | Admitting: *Deleted

## 2014-03-06 NOTE — Telephone Encounter (Signed)
Results   Pulmonary function test (Order [id number])      Pulmonary function test  Status: EditedResult-FINAL Visible to patient:  Not Released Nextappt: 04/25/2014 at 03:00 PM in Cardiology [facility] Daiva Eves, MD) Dx:  Dyspnea       Notes Recorded by Josue Hector, MD on 03/06/2014 at 11:51 AM Mild emphysema and air trapping no response to inhaler should be ok for surgery     Ref Range 5d ago    FVC-Pre L 4.44   FVC-%Pred-Pre % 105   FVC-Post L 4.74   FVC-%Pred-Post % 112   FVC-%Change-Post % 6   FEV1-Pre L 3.40   FEV1-%Pred-Pre % 110   FEV1-Post L 3.63   FEV1-%Pred-Post % 117   FEV1-%Change-Post % 6   FEV6-Pre L 4.37   FEV6-%Pred-Pre % 110   FEV6-Post L 4.66   FEV6-%Pred-Post % 117   FEV6-%Change-Post % 6   Pre FEV1/FVC ratio % 76   FEV1FVC-%Pred-Pre % 104   Post FEV1/FVC ratio % 77   FEV1FVC-%Change-Post % 0   Pre FEV6/FVC Ratio % 99   FEV6FVC-%Pred-Pre % 105   Post FEV6/FVC ratio % 98   FEV6FVC-%Pred-Post % 104   FEV6FVC-%Change-Post % 0   FEF 25-75 Pre L/sec 2.85   FEF2575-%Pred-Pre % 122   FEF 25-75 Post L/sec 3.53   FEF2575-%Pred-Post % 151   FEF2575-%Change-Post % 23   RV L 2.11   RV % pred % 87   TLC L 7.10   TLC % pred % 103   DLCO unc ml/min/mmHg 24.59   DLCO unc % pred % 79   DL/VA ml/min/mmHg/L 3.69   DL/VA % pred % 81   Resulting Agency [facility]    Specimen Collected: 03/01/14 3:19 PM Last Resulted: 03/01/14 4:21 PM               Results         Scan on 03/01/2014 9:16 PM by Deneise Lever, [place] on 03/01/2014 9:16 PM by Deneise Lever, MD          Result Notes     Notes Recorded by Josue Hector, MD on 03/06/2014 at 11:51 AM Mild emphysema and air trapping no response to inhaler should be ok for surgery         Reviewed by List     Josue Hector, MD on 03/06/2014 11:51 AM     Encounter     View Encounter      Result Information     Status     Edited Result - FINAL (03/01/2014 4:21 PM)    Provider Status: Reviewed        Lab Information     [facility]          Order-Level Documents:     There are no order-level documents.    Pulmonary function test (Order [id number])  PFT  Order: [id number]   Released By: Ilona Sorrel  Authorizing: Josue Hector, MD   Date: 03/01/2014  Department: [facility]        Josue Hector, MD NPI: [id number]       Patient Information     Patient Name Sex DOB SSN    Jacob, Christian Male [birth year] [id number]      Order Information     Order Date/Time Release Date/Time Start Date/Time End Date/Time    02/08/14 11:23 AM 03/01/14 03:19 PM 03/01/14 03:19 PM None      Order History  Outpatient  Date/Time Action Taken User Additional Information    03/01/14 1519 Release Ilona Sorrel FromOrder:[id number]    03/01/14 1519 Result [facility] Final    03/01/14 1519 Result [facility] Interface Preliminary    03/01/14 1621 Result  Final-Edited    03/01/14 1621 Result  Preliminary      Order Questions     Question Answer Comment    Where should this test be performed? [facility]     Full PFT: includes the following: basic spirometry, spirometry pre & post bronchodilator, diffusion capacity (DLCO), lung volumes Full PFT     MIP/MEP Yes     6 minute walk Yes     ABG Yes     Diffusion capacity (DLCO) Yes     Lung volumes Yes     Methacholine challenge Yes       Associated Diagnoses     Dyspnea        Appointments for this Order     03/01/2014 4:00 PM - 60 min Deneise Lever, MD [facility]      Collection Information     Collected: 03/01/2014 3:19 PM   Resulting Agency: [facility]     Josue Hector, MD on 03/06/2014 11:51 AM     Order-Level Documents:       Scan on 03/01/2014 9:16 PM  by Deneise Lever, [place] on 03/01/2014 9:16 PM by Deneise Lever, MD      Encounter     View Encounter       Verbal Order Info     Action Created on Order Mode Entered by Responsible Provider Signed by Signed on    Ordering 02/08/14 1123 Verbal with readback Richmond Campbell, LPN Josue Hector, MD Josue Hector, MD 02/08/14 2327      PT  AWARE OF   PFT  RESULTS./CY

## 2014-03-24 ENCOUNTER — Ambulatory Visit: Payer: Medicare Other | Admitting: Interventional Cardiology

## 2014-04-25 ENCOUNTER — Ambulatory Visit (INDEPENDENT_AMBULATORY_CARE_PROVIDER_SITE_OTHER): Payer: Medicare Other | Admitting: Interventional Cardiology

## 2014-04-25 ENCOUNTER — Encounter: Payer: Self-pay | Admitting: Interventional Cardiology

## 2014-04-25 VITALS — BP 134/76 | HR 66 | Ht 71.0 in | Wt 185.0 lb

## 2014-04-25 DIAGNOSIS — E785 Hyperlipidemia, unspecified: Secondary | ICD-10-CM

## 2014-04-25 DIAGNOSIS — I359 Nonrheumatic aortic valve disorder, unspecified: Secondary | ICD-10-CM

## 2014-04-25 DIAGNOSIS — E279 Disorder of adrenal gland, unspecified: Secondary | ICD-10-CM

## 2014-04-25 DIAGNOSIS — I10 Essential (primary) hypertension: Secondary | ICD-10-CM

## 2014-04-25 DIAGNOSIS — E278 Other specified disorders of adrenal gland: Secondary | ICD-10-CM

## 2014-04-25 NOTE — Patient Instructions (Signed)
Your physician recommends that you continue on your current medications as directed. Please refer to the Current Medication list given to you today.  Your physician wants you to follow-up in: 1 year with Dr.Smith You will receive a reminder letter in the mail two months in advance. If you don't receive a letter, please call our office to schedule the follow-up appointment.  

## 2014-04-25 NOTE — Progress Notes (Addendum)
Patient ID: Jacob Christian, male   DOB: 10/23/1940, 74 y.o.   MRN: [id number]    [address], [address] [place], [place]  [address] Phone: [phone] Fax:  [phone]  Date:  04/25/2014   ID:  SOLMON BOHR, DOB 10/27/1940, MRN [id number]  PCP:  Donnie Coffin, MD   ASSESSMENT:  1. Moderate aortic stenosis, asymptomatic 2. COPD with continued smoking habit 3. Abdominal mass, left mid and upper abdomen. upcoming surgery in January 2016 4. Peripheral arterial disease, stable  PLAN:  1. Cleared for upcoming abdominal surgery to remove a large left abdominal adrenal mass 2. Follow-up aortic stenosis in one year 3. No change in medical regimen.   SUBJECTIVE: Jacob Christian is a 74 y.o. male is doing well. He has upcoming surgery with Dr. Johney Maine for resection of and adrenal mass. He denies angina, syncope, but has chronic dyspnea on exertion due to COPD. No peripheral edema. He has not had palpitations or near-syncope. There is no orthopnea. Exertional tolerance has been stable. Appetite is been stable. He is very anxious about his upcoming abdominal surgery.   Wt Readings from Last 3 Encounters:  04/25/14 185 lb (83.915 kg)  02/14/14 178 lb (80.74 kg)  02/08/14 179 lb (81.194 kg)     Past Medical History  Diagnosis Date  . Hypertension   . Arthritis   . GERD (gastroesophageal reflux disease)   . Anxiety   . COPD (chronic obstructive pulmonary disease)   . Peripheral vascular disease   . Sciatica   . Heart murmur     Current Outpatient Prescriptions  Medication Sig Dispense Refill  . acetaminophen (TYLENOL) 500 MG tablet Take 500 mg by mouth every 6 (six) hours as needed.    . ALPRAZolam (XANAX) 0.5 MG tablet Take 0.5 mg by mouth 3 (three) times daily as needed for anxiety.    Marland Kitchen amLODipine (NORVASC) 5 MG tablet Take 5 mg by mouth daily.    Marland Kitchen etodolac (LODINE) 400 MG tablet Take 400 mg by mouth as needed for mild pain.    . Flaxseed, Linseed, (FLAX  SEED OIL) 1000 MG CAPS Take 1 capsule by mouth 2 (two) times a week.    Marland Kitchen FLUZONE HIGH-DOSE 0.5 ML SUSY Inject into the muscle.     Marland Kitchen HYDROcodone-acetaminophen (NORCO/VICODIN) 5-325 MG per tablet Take as needed for pain    . lisinopril (PRINIVIL,ZESTRIL) 40 MG tablet Take 40 mg by mouth daily.    . Multiple Vitamin (MULTIVITAMIN WITH MINERALS) TABS tablet Take 1 tablet by mouth daily.    . pantoprazole (PROTONIX) 40 MG tablet Take 1 tab in the am     No current facility-administered medications for this visit.    Allergies:    Allergies  Allergen Reactions  . Citalopram Hydrobromide     Pt did not like the way the pill made him feel " just didn't care about anything"  . Codeine   . Dilaudid [Hydromorphone Hcl] Itching  . Vicodin [Hydrocodone-Acetaminophen] Itching and Nausea Only    Only with whole tablet    Social History:  The patient  reports that he has been smoking.  He does not have any smokeless tobacco history on file. He reports that he drinks alcohol. He reports that he does not use illicit drugs.   ROS:  Please see the history of present illness.   Poor appetite  , stable weight, denies abdominal pain All other systems reviewed and negative.   OBJECTIVE: VS:  BP  134/76 mmHg  Pulse 66  Ht 5\' 11"  (1.803 m)  Wt 185 lb (83.915 kg)  BMI 25.81 kg/m2 Well nourished, well developed, in no acute distress, appears older than stated age 59: normal Neck: JVD flat. Carotid bruit absent  Cardiac:  normal S1, S2; RRR; 3/6 systolic murmur at the right upper sternal border. There is also an apical systolic murmur Lungs:  clear to auscultation bilaterally, no wheezing, rhonchi or rales Abd: soft, nontender, no hepatomegalyThere is a firm somewhat mobile mass in the left mid to upper quadrant of the abdomen. There is no tenderness. No home or rub is heard. Ext: Edema absent. Pulses 2+ Skin: warm and dry Neuro:  CNs 2-12 intact, no focal abnormalities noted  EKG:  Not repeated        Signed, Illene Labrador III, MD 04/25/2014 3:35 PM

## 2014-05-03 NOTE — Patient Instructions (Signed)
[name]  05/03/2014   Your procedure is scheduled on: 05/12/14    Report to [facility] Main  Entrance and follow signs to               [place] at 5:30  AM.   Call this number if you have problems the morning of surgery [phone]   Remember:  Do not eat food or drink liquids :After Midnight.     Take these medicines the morning of surgery with A SIP OF WATER:                                You may not have any metal on your body including hair pins and              piercings  Do not wear jewelry, make-up, lotions, powders or perfumes.             Do not wear nail polish.  Do not shave  48 hours prior to surgery.              Men may shave face and neck.   Do not bring valuables to the hospital. [place].  Contacts, dentures or bridgework may not be worn into surgery.  Leave suitcase in the car. After surgery it may be brought to your room.     Patients discharged the day of surgery will not be allowed to drive home.  Name and phone number of your driver:  Special Instructions: N/A              Please read over the following fact sheets you were given: _____________________________________________________________________                                                     [place]  Before surgery, you can play an important role.  Because skin is not sterile, your skin needs to be as free of germs as possible.  You can reduce the number of germs on your skin by washing with CHG (chlorahexidine gluconate) soap before surgery.  CHG is an antiseptic cleaner which kills germs and bonds with the skin to continue killing germs even after washing. Please DO NOT use if you have an allergy to CHG or antibacterial soaps.  If your skin becomes reddened/irritated stop using the CHG and inform your nurse when you arrive at Short Stay. Do not shave (including legs and  underarms) for at least 48 hours prior to the first CHG shower.  You may shave your face. Please follow these instructions carefully:   1.  Shower with CHG Soap the night before surgery and the  morning of Surgery.   2.  If you choose to wash your hair, wash your hair first as usual with your  normal  Shampoo.   3.  After you shampoo, rinse your hair and body thoroughly to remove the  shampoo.  4.  Use CHG as you would any other liquid soap.  You can apply chg directly  to the skin and wash . Gently wash with scrungie or clean wascloth    5.  Apply the CHG Soap to your body ONLY FROM THE NECK DOWN.   Do not use on open                           Wound or open sores. Avoid contact with eyes, ears mouth and genitals (private parts).                        Genitals (private parts) with your normal soap.              6.  Wash thoroughly, paying special attention to the area where your surgery  will be performed.   7.  Thoroughly rinse your body with warm water from the neck down.   8.  DO NOT shower/wash with your normal soap after using and rinsing off  the CHG Soap .                9.  Pat yourself dry with a clean towel.             10.  Wear clean pajamas.             11.  Place clean sheets on your bed the night of your first shower and do not  sleep with pets.  Day of Surgery : Do not apply any lotions/deodorants the morning of surgery.  Please wear clean clothes to the hospital/surgery center.  FAILURE TO FOLLOW THESE INSTRUCTIONS MAY RESULT IN THE CANCELLATION OF YOUR SURGERY    PATIENT SIGNATURE_________________________________  ______________________________________________________________________

## 2014-05-03 NOTE — Progress Notes (Signed)
Need orders in EPIC please - pt coming for preop Fri 05/05/14 thank you

## 2014-05-04 ENCOUNTER — Other Ambulatory Visit (INDEPENDENT_AMBULATORY_CARE_PROVIDER_SITE_OTHER): Payer: Self-pay | Admitting: Surgery

## 2014-05-04 NOTE — H&P (Addendum)
Haskell Flirt Kittel 02/01/2014 11:01 AM Location: [facility] Patient #: [id number] DOB: 01/03/1941 Single / Language: Cleophus Molt / Race: White Male  History of Present Illness   Patient words: Adrenal mass.  The patient is a 74 year old male who presents wtih an adrenal mass. Pleasant patient. Comes today with his daughter. Tends to avoid doctors. Heavy smoker. History of falls with chronic left shoulder and chest wall pain. Some arthritis with right knee pain. Moderately active. Had some discomfort. Mass felt in abdomen by Dr Alroy Dust on exam. Underwent CT scan. Large mass found on left kidney suspicious for adrenal tumor. Surgical consultation requested. He denies much abdominal pain. Has bowel movements every day. Never had a colonoscopy. Continues to smoke. Been told to consider colonoscopy in quit smoking. Still thinking about it. Daughter thinks he has some mild shortness of breath with activity but not severe. The patient was referred by a primary care provider. Initial presentation was 3 week(s) ago. Presentation included pain. Past evaluation has included CT. Symptoms include asymptomatic. Symptoms are located in the left adrenal. The mass is described as smooth. Onset was sudden. The patient is not currently being treated for this problem. Risk factors do not include impaired immunity, radiation for other malignancy or environmental radiation exposure. Pertinent family history does not include autoimmune disease, familial adenomatous polyposis, multiple endocrine neoplasia type 2 or pheochromocytoma.   Other Problems Briant Cedar, CMA; 02/01/2014 11:01 AM) Anxiety Disorder Arthritis Gastroesophageal Reflux Disease High blood pressure  Past Surgical History Briant Cedar, CMA; 02/01/2014 11:01 AM) Knee Surgery Right.  Diagnostic Studies History Adin Hector, MD; 02/01/2014 3:38 PM) Colonoscopy never Echocardiogram10/14/2015 Transthoracic  Echocardiography Patient: Ova, Meegan MR #: [id number] Study Date: 01/25/2014 Gender: M Age: 65 Height: 180.3 cm Weight: 81.2 kg BSA: 2.02 m^2 Pt. Status: Room: ATTENDING Mertie Moores, M.D. SONOGRAPHER [place], RDCS ORDERING Donnie Coffin Garnette Czech, Dean PERFORMING [facility], Outpatient cc: ------------------------------------------------------------------- LV EF: 60% - 65% ------------------------------------------------------------------- Indications: Aortic valve disease (I35.9). ------------------------------------------------------------------- History: Risk factors: Arthritis. GERD. Anxiety. Osteoarthritis. Current tobacco use. Hypertension. ------------------------------------------------------------------- Study Conclusions - Left ventricle: The cavity size was normal. Wall thickness was increased in a pattern of mild LVH. Systolic function was normal. The estimated ejection fraction was in the range of 60% to 65%. Wall motion was normal; there were no regional wall motion abnormalities. Doppler parameters are consistent with abnormal left ventricular relaxation (grade 1 diastolic dysfunction). - Aortic valve: Mildly calcified annulus. Moderately thickened, moderately calcified leaflets. There was mild to moderate stenosis. Valve area (VTI): 1.12 cm^2. Valve area (Vmean): 0.97 cm^2. - Left atrium: The atrium was mildly to moderately dilated. - Right atrium: The atrium was mildly to moderately dilated. - Pulmonary arteries: Systolic pressure was mildly increased. PA peak pressure: 35 mm Hg (S).  Allergies Briant Cedar, CMA; 02/01/2014 11:03 AM) Codeine/Codeine Derivatives  Medication History Briant Cedar, CMA; 02/01/2014 11:04 AM) AmLODIPine Besylate (5MG  Tablet, Oral) Active. Lisinopril (40MG  Tablet, Oral) Active. Pantoprazole Sodium (40MG  Tablet DR, Oral) Active. Etodolac (400MG  Tablet, Oral) Active. Hydrocodone-Acetaminophen (5-325MG  Tablet, Oral) Active.  Social  History Briant Cedar, [place]; 02/01/2014 11:01 AM) Alcohol use Occasional alcohol use. Caffeine use Carbonated beverages, Coffee, Tea. No drug use Tobacco use Current every day smoker.  Family History Briant Cedar, [place]; 02/01/2014 11:01 AM) Cancer Mother. Heart Disease Father. Hypertension Father. Prostate Cancer Brother.  Review of Systems Briant Cedar CMA; 02/01/2014 11:01 AM) General Not Present- Appetite Loss, Chills, Fatigue, Fever, Night Sweats, Weight Gain and Weight Loss. Skin Not  Present- Change in Wart/Mole, Dryness, Hives, Jaundice, New Lesions, Non-Healing Wounds, Rash and Ulcer. HEENT Present- Hearing Loss and Wears glasses/contact lenses. Not Present- Earache, Hoarseness, Nose Bleed, Oral Ulcers, Ringing in the Ears, Seasonal Allergies, Sinus Pain, Sore Throat, Visual Disturbances and Yellow Eyes. Cardiovascular Present- Leg Cramps and Shortness of Breath. Not Present- Chest Pain, Difficulty Breathing Lying Down, Palpitations, Rapid Heart Rate and Swelling of Extremities. Gastrointestinal Not Present- Abdominal Pain, Bloating, Bloody Stool, Change in Bowel Habits, Chronic diarrhea, Constipation, Difficulty Swallowing, Excessive gas, Gets full quickly at meals, Hemorrhoids, Indigestion, Nausea, Rectal Pain and Vomiting. Male Genitourinary Not Present- Blood in Urine, Change in Urinary Stream, Frequency, Impotence, Nocturia, Painful Urination, Urgency and Urine Leakage. Neurological Not Present- Decreased Memory, Fainting, Headaches, Numbness, Seizures, Tingling, Tremor, Trouble walking and Weakness. Psychiatric Present- Anxiety. Not Present- Bipolar, Change in Sleep Pattern, Depression, Fearful and Frequent crying. Endocrine Not Present- Cold Intolerance, Excessive Hunger, Hair Changes, Heat Intolerance, Hot flashes and New Diabetes. Hematology Not Present- Easy Bruising, Excessive bleeding, Gland problems, HIV and Persistent Infections.   Vitals Briant Cedar CMA; 02/01/2014 11:05 AM) 02/01/2014 11:04 AM Weight: 181 lb Height: 69in Body Surface Area: 2 m Body Mass Index: 26.73 kg/m Temp.: 97.28F  Pulse: 63 (Regular)  BP: 160/80 (Sitting, Left Arm, Standard)    Physical Exam Adin Hector MD; 02/01/2014 11:55 AM) General Mental Status-Alert. General Appearance-Not in acute distress, Not Sickly. Orientation-Oriented X3. Hydration-Well hydrated. Voice-Normal.  Integumentary Global Assessment Upon inspection and palpation of skin surfaces of the - Axillae: non-tender, no inflammation or ulceration, no drainage. and Distribution of scalp and body hair is normal. General Characteristics Temperature - normal warmth is noted.  Head and Neck Head-normocephalic, atraumatic with no lesions or palpable masses. Face Global Assessment - atraumatic, no absence of expression. Neck Global Assessment - no abnormal movements, no bruit auscultated on the right, no bruit auscultated on the left, no decreased range of motion, non-tender. Trachea-midline. Thyroid Gland Characteristics - non-tender.  Eye Eyeball - Left-Extraocular movements intact, No Nystagmus. Eyeball - Right-Extraocular movements intact, No Nystagmus. Cornea - Left-No Hazy. Cornea - Right-No Hazy. Sclera/Conjunctiva - Left-No scleral icterus, No Discharge. Sclera/Conjunctiva - Right-No scleral icterus, No Discharge. Pupil - Left-Direct reaction to light normal. Pupil - Right-Direct reaction to light normal.  ENMT Ears Pinna - Left - no drainage observed, no generalized tenderness observed. Right - no drainage observed, no generalized tenderness observed. Nose and Sinuses External Inspection of the Nose - no destructive lesion observed. Inspection of the nares - Left - quiet respiration. Right - quiet respiration. Mouth and Throat Lips - Upper Lip - no fissures observed, no pallor noted. Lower Lip - no fissures observed, no  pallor noted. Nasopharynx - no discharge present. Oral Cavity/Oropharynx - Tongue - no dryness observed. Oral Mucosa - no cyanosis observed. Hypopharynx - no evidence of airway distress observed.  Chest and Lung Exam Inspection Movements - Normal and Symmetrical. Accessory muscles - No use of accessory muscles in breathing. Palpation Palpation of the chest reveals - Non-tender. Auscultation Breath sounds - Normal and Clear.  Cardiovascular Auscultation Rhythm - Regular. Murmurs & Other Heart Sounds - Auscultation of the heart reveals - No Murmurs and No Systolic Clicks.  Abdomen Inspection Inspection of the abdomen reveals - No Visible peristalsis and No Abnormal pulsations. Umbilicus - No Bleeding, No Urine drainage. Palpation/Percussion Palpation and Percussion of the abdomen reveal - Soft, Non Tender, No Rebound tenderness, No Rigidity (guarding) and No Cutaneous hyperesthesia. Note: Abdomen soft and flat.  No umbilical hernia. Obvious left upper quadrant spherical mobile mass about the size of a grapefruit. Not fluctuant. The patient and daughter feel it as well.   Male Genitourinary Sexual Maturity Tanner 5 - Adult hair pattern and Adult penile size and shape.  Peripheral Vascular Upper Extremity Inspection - Left - No Cyanotic nailbeds, Not Ischemic. Right - No Cyanotic nailbeds, Not Ischemic.  Neurologic Neurologic evaluation reveals -normal attention span and ability to concentrate, able to name objects and repeat phrases. Appropriate fund of knowledge , normal sensation and normal coordination. Mental Status Affect - not angry, not paranoid. Cranial Nerves-Normal Bilaterally. Gait-Normal.  Neuropsychiatric Mental status exam performed with findings of-able to articulate well with normal speech/language, rate, volume and coherence, thought content normal with ability to perform basic computations and apply abstract reasoning and no evidence of hallucinations,  delusions, obsessions or homicidal/suicidal ideation.  Musculoskeletal Global Assessment Spine, Ribs and Pelvis - no instability, subluxation or laxity. Right Upper Extremity - no instability, subluxation or laxity.  Lymphatic Head & Neck  General Head & Neck Lymphatics: Bilateral - Description - No Localized lymphadenopathy. Axillary  General Axillary Region: Bilateral - Description - No Localized lymphadenopathy. Femoral & Inguinal  Generalized Femoral & Inguinal Lymphatics: Left - Description - No Localized lymphadenopathy. Right - Description - No Localized lymphadenopathy.    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 It is mostly mobile and smooth so hopefully likelihood less. This will require surgical resection. Would plan hand-assisted given large size with low threshold to convert to open. Discussed with my surgical partners (Rosenbower, Gerkin)as well.  The anatomy and physiology of the adrenal gland was discussed. Pathophysiology of the adrenal problem was discussed. Options were discussed, and I made a recommendation to remove the adrenal gland & to treat the pathology. Minimally invasive & open techniques discussed.  Risks of bleeding, infection, injury to other organs, reoperation, death, and other risks were discussed. I noted a good likelihood this will help address the problem. While there are risks, I feel the risks of nonoperative management are greater; therefore, I feel surgery offers the best option. Educational material was available. We will work to minimize complications.  Obtain plasma and urinary studies to make sure is not a hormone producing tumor, especially a pheochromocytoma. He is only on 2 blood pressure medications which control his hypertension well so I doubt that.  He will require cardiac clearance. Sees Dr. Daneen Schick.  I strongly  encouraged him to quit smoking as well. Current Plans  Instructions:  STOP SMOKING!  We strongly recommend that you stop smoking. Smoking increases the risk of surgery including infection in the form of an open wound, pus formation, abscess, hernia at an incision on the abdomen, etc. You have an increased risk of other MAJOR complications such as stroke, heart attack, forming clots in the leg and/or lungs, and death.  Smoking Cessation Quitting smoking is important to your health and has many advantages. However, it is not always easy to quit since nicotine is a very addictive drug. Often times, people try 3 times or more before being able to quit. This document explains the best ways for you to prepare to quit smoking. Quitting takes hard work and a lot of effort, but you can do it. ADVANTAGES OF QUITTING SMOKING  You will live longer, feel better, and live better.  Your body will feel the impact of quitting  smoking almost immediately.  Within 20 minutes, blood pressure decreases. Your pulse returns to its normal level.  After 8 hours, carbon monoxide levels in the blood return to normal. Your oxygen level increases.  After 24 hours, the chance of having a heart attack starts to decrease. Your breath, hair, and body stop smelling like smoke.  After 48 hours, damaged nerve endings begin to recover. Your sense of taste and smell improve.  After 72 hours, the body is virtually free of nicotine. Your bronchial tubes relax and breathing becomes easier.  After 2 to 12 weeks, lungs can hold more air. Exercise becomes easier and circulation improves.  The risk of having a heart attack, stroke, cancer, or lung disease is greatly reduced.  After 1 year, the risk of coronary heart disease is cut in half.  After 5 years, the risk of stroke falls to the same as a nonsmoker.  After 10 years, the risk of lung cancer is cut in half and the risk of other cancers decreases significantly.  After  15 years, the risk of coronary heart disease drops, usually to the level of a nonsmoker.  If you are pregnant, quitting smoking will improve your chances of having a healthy baby.  The people you live with, especially any children, will be healthier.  You will have extra money to spend on things other than cigarettes. QUESTIONS TO THINK ABOUT BEFORE ATTEMPTING TO QUIT You may want to talk about your answers with your caregiver.  Why do you want to quit?  If you tried to quit in the past, what helped and what did not?  What will be the most difficult situations for you after you quit? How will you plan to handle them?  Who can help you through the tough times? Your family? Friends? A caregiver?  What pleasures do you get from smoking? What ways can you still get pleasure if you quit? Here are some questions to ask your caregiver:  How can you help me to be successful at quitting?  What medicine do you think would be best for me and how should I take it?  What should I do if I need more help?  What is smoking withdrawal like? How can I get information on withdrawal? GET READY  Set a quit date.  Change your environment by getting rid of all cigarettes, ashtrays, matches, and lighters in your home, car, or work. Do not let people smoke in your home.  Review your past attempts to quit. Think about what worked and what did not. GET SUPPORT AND ENCOURAGEMENT You have a better chance of being successful if you have help. You can get support in many ways.  Tell your family, friends, and co-workers that you are going to quit and need their support. Ask them not to smoke around you.  Get individual, group, or telephone counseling and support. Programs are available at [employer] and health centers. Call your local health department for information about programs in your area.  Spiritual beliefs and practices may help some smokers quit.  Download a "quit meter" on your computer  to keep track of quit statistics, such as how long you have gone without smoking, cigarettes not smoked, and money saved.  Get a self-help book about quitting smoking and staying off of tobacco. Vass yourself from urges to smoke. Talk to someone, go for a walk, or occupy your time with a task.  Change your normal routine. Take  a different route to work. Drink tea instead of coffee. Eat breakfast in a different place.  Reduce your stress. Take a hot bath, exercise, or read a book.  Plan something enjoyable to do every day. Reward yourself for not smoking.  Explore interactive web-based programs that specialize in helping you quit. GET MEDICINE AND USE IT CORRECTLY Medicines can help you stop smoking and decrease the urge to smoke. Combining medicine with the above behavioral methods and support can greatly increase your chances of successfully quitting smoking.  Nicotine replacement therapy helps deliver nicotine to your body without the negative effects and risks of smoking. Nicotine replacement therapy includes nicotine gum, lozenges, inhalers, nasal sprays, and skin patches. Some may be available over-the-counter and others require a prescription.  Antidepressant medicine helps people abstain from smoking, but how this works is unknown. This medicine is available by prescription.  Nicotinic receptor partial agonist medicine simulates the effect of nicotine in your brain. This medicine is available by prescription. Ask your caregiver for advice about which medicines to use and how to use them based on your health history. Your caregiver will tell you what side effects to look out for if you choose to be on a medicine or therapy. Carefully read the information on the package. Do not use any other product containing nicotine while using a nicotine replacement product. RELAPSE OR DIFFICULT SITUATIONS Most relapses occur within the first 3 months after  quitting. Do not be discouraged if you start smoking again. Remember, most people try several times before finally quitting. You may have symptoms of withdrawal because your body is used to nicotine. You may crave cigarettes, be irritable, feel very hungry, cough often, get headaches, or have difficulty concentrating. The withdrawal symptoms are only temporary. They are strongest when you first quit, but they will go away within 10 14 days. To reduce the chances of relapse, try to:  Avoid drinking alcohol. Drinking lowers your chances of successfully quitting.  Reduce the amount of caffeine you consume. Once you quit smoking, the amount of caffeine in your body increases and can give you symptoms, such as a rapid heartbeat, sweating, and anxiety.  Avoid smokers because they can make you want to smoke.  Do not let weight gain distract you. Many smokers will gain weight when they quit, usually less than 10 pounds. Eat a healthy diet and stay active. You can always lose the weight gained after you quit.  Find ways to improve your mood other than smoking. FOR MORE INFORMATION www.smokefree.gov   While it can be one of the most difficult things to do, the [place] community has programs to help you stop. Consider talking with your primary care physician about options. Also, Smoking Cessation classes are available through the [facility]:  The smoking cessation program is a proven-effective program from the American Lung Association. The program is available for anyone 66 and older who currently smokes. The program lasts for 7 weeks and is 8 sessions. Each class will be approximately 1 1/2 hours. The program is every Tuesday. All classes are 12-1:30pm and same location.  Event Location Information: Location: [place] 2nd Floor Conference Room 2-037; located next to [facility] cross streets: [place] Entrance into the [facility] is adjacent to the [employer] main entrance. The conference room is located on the 2nd floor. Parking Instructions: Visitor parking is adjacent to [employer] main entrance and  the [place]   A smoking cessation program is also offered through the [facility]. Register online at ClickDebate.gl or call [phone] for more information. Tobacco cessation counseling is available at [facility]. Call [phone] for a free appointment. Tobacco cessation classes also are available through the [facility] in [place]. For information, call [phone]. The Patient Education Network features videos on tobacco cessation. Please consult your listings in the center of this book to find instructions on how to access this resource. If you want more information, ask your nurse.    Addendum     Marin Olp, MD  Tue Jan 17, 2014 2:02:02 PM EDT     ADDENDUM REPORT: 01/17/2014 13:59  ADDENDUM: These results will be called to the ordering clinician or representative by the Radiologist Assistant, and communication documented in the PACS or zVision Dashboard.   Electronically Signed  By: Marin Olp M.D.  On: 01/17/2014 13:59      Study Result     CLINICAL DATA: Physician felt abdominal mass on exam.  EXAM: CT ABDOMEN AND PELVIS WITH CONTRAST  TECHNIQUE: Multidetector CT imaging of the abdomen and pelvis was performed using the standard protocol following bolus administration of intravenous contrast.  CONTRAST: 131mL OMNIPAQUE IOHEXOL 300 MG/ML SOLN  COMPARISON: None.  FINDINGS: Lung bases are within normal. Several old right lower rib fractures are present. Increased density over the mitral and aortic valves may be calcification.  Abdominal images demonstrate a a well-defined round mass in the left mid to upper quadrant measuring 13.6 x 13.7 cm x 14.8 cm in its  AP, transverse and craniocaudal dimensions. There are areas of mural nodularity and patchy areas of calcification within this mass. This mass appears to be originating from the lateral limb of the left adrenal gland, although cannot exclude the possibility of a pancreatic tail mass. This mass displaces the left kidney slightly posteriorly. This mass appears to displace the stomach and pancreatic tail anterior medially. There are a few subcentimeter periaortic lymph nodes.  The liver, spleen, gallbladder and pancreas are otherwise unremarkable. The right adrenal gland demonstrates a 2.7 cm low-density mass with Hounsfield unit measurements of 14 likely an adenoma. Kidneys are normal in size as there is a 2 mm stone over the mid pole of the left kidney. Several vascular calcifications are present over the renal hilum bilaterally. There are a few small bilateral subcentimeter renal cortical hypodensities too small to characterize but likely cysts. There is mild stranding/ fluid in the left perinephric space. Ureters are unremarkable. There is diverticulosis throughout the colon. The appendix is normal. There is moderate calcified plaque over the abdominal aorta and iliac arteries.  Pelvic images demonstrate the bladder, prostate and rectum to be within normal. There are degenerative changes of the spine and hips. There is moderate disc disease at the L3-4 level. There are a few tiny sclerotic foci over the pelvic bones/proximal femurs likely bone islands.  IMPRESSION: Large well-defined round mass within the left mid to upper abdomen measuring 13.6 x 13.7 x 14.8 cm with mural nodularity and patchy areas of calcification. 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 Other adrenal processes such as metastatic disease, complicated hematoma or pheochromocytoma are unlikely. Other retroperitoneal  neoplasms or possible pancreatic tail neoplasm are less likely. Primary adrenal neoplasm may or may not be hormonally active as consider correlation with cortisol levels and electrolytes. PET-CT  may also be helpful prior to surgical consultation.  Few small bilateral renal cortical hypodensities likely cysts but too small to characterize. 2 mm left renal stone.  Diverticulosis of the colon.  Electronically Signed: By: Marin Olp M.D. On: 01/17/2014 13:49     Instructions:  You have a mass growing in your left adrenal gland on top of your kidney. It needs to be removed. Risk of cancer.  Cardiologist has cleared you.  Continue to walk an hour a day to improve exercise tolerance.  STOP SMOKING! We talked to the patient about the dangers of smoking.  We stressed that tobacco use dramatically increases the risk of peri-operative complications such as infection, tissue necrosis leaving to problems with incision/wound and organ healing, hernia, chronic pain, heart attack, stroke, DVT, pulmonary embolism, and death.  We noted there are programs in our community to help stop smoking.  Information was available.   We are going to order blood and urine studies to better evaluate this mass.  Consider colonoscopy at some point to rule out any colon polyps or cancer  Adin Hector, M.D., F.A.C.S. Gastrointestinal and Minimally Invasive Surgery [facility] [address] [place], [place] [address] [phone] Main / Paging

## 2014-05-05 ENCOUNTER — Inpatient Hospital Stay (HOSPITAL_COMMUNITY)
Admission: RE | Admit: 2014-05-05 | Discharge: 2014-05-05 | Disposition: A | Discharge status: Home / Self Care | Payer: Medicare Other | Source: Ambulatory Visit

## 2014-05-10 ENCOUNTER — Ambulatory Visit (HOSPITAL_COMMUNITY)
Admission: RE | Admit: 2014-05-10 | Discharge: 2014-05-10 | Disposition: A | Discharge status: Home / Self Care | Payer: Medicare Other | Source: Ambulatory Visit | Attending: Anesthesiology | Admitting: Anesthesiology

## 2014-05-10 ENCOUNTER — Encounter (HOSPITAL_COMMUNITY)
Admission: RE | Admit: 2014-05-10 | Discharge: 2014-05-10 | Disposition: A | Discharge status: Home / Self Care | Payer: Medicare Other | Source: Ambulatory Visit | Attending: Surgery | Admitting: Surgery

## 2014-05-10 ENCOUNTER — Encounter (HOSPITAL_COMMUNITY): Payer: Self-pay

## 2014-05-10 DIAGNOSIS — Z01818 Encounter for other preprocedural examination: Secondary | ICD-10-CM | POA: Insufficient documentation

## 2014-05-10 DIAGNOSIS — I10 Essential (primary) hypertension: Secondary | ICD-10-CM

## 2014-05-10 DIAGNOSIS — E279 Disorder of adrenal gland, unspecified: Secondary | ICD-10-CM | POA: Diagnosis not present

## 2014-05-10 LAB — BASIC METABOLIC PANEL
Anion gap: 8 (ref 5–15)
BUN: 20 mg/dL (ref 6–23)
CO2: 26 mmol/L (ref 19–32)
Calcium: 9.2 mg/dL (ref 8.4–10.5)
Chloride: 104 mmol/L (ref 96–112)
Creatinine, Ser: 1.08 mg/dL (ref 0.50–1.35)
GFR calc Af Amer: 78 mL/min — ABNORMAL LOW (ref >90)
GFR calc non Af Amer: 67 mL/min — ABNORMAL LOW (ref >90)
Glucose, Bld: 101 mg/dL — ABNORMAL HIGH (ref 70–99)
Potassium: 4.4 mmol/L (ref 3.5–5.1)
Sodium: 138 mmol/L (ref 135–145)

## 2014-05-10 LAB — CBC
HCT: 37.8 % — ABNORMAL LOW (ref 39.0–52.0)
Hemoglobin: 12.6 g/dL — ABNORMAL LOW (ref 13.0–17.0)
MCH: 32.6 pg (ref 26.0–34.0)
MCHC: 33.3 g/dL (ref 30.0–36.0)
MCV: 97.9 fL (ref 78.0–100.0)
Platelets: 267 10*3/uL (ref 150–400)
RBC: 3.86 MIL/uL — ABNORMAL LOW (ref 4.22–5.81)
RDW: 12.9 % (ref 11.5–15.5)
WBC: 10.1 10*3/uL (ref 4.0–10.5)

## 2014-05-10 NOTE — Progress Notes (Signed)
EKG 12/20/13 on EPIC, ECHO 01/25/14 on EPIC, Carotid doppler 02/14/14 on EPIC, stress test 02/15/14 on EPIC

## 2014-05-10 NOTE — Progress Notes (Signed)
   05/10/14 1324  OBSTRUCTIVE SLEEP APNEA  Have you ever been diagnosed with sleep apnea through a sleep study? No  Do you snore loudly (loud enough to be heard through closed doors)?  1  Do you often feel tired, fatigued, or sleepy during the daytime? 0  Has anyone observed you stop breathing during your sleep? 0  Do you have, or are you being treated for high blood pressure? 1  BMI more than 35 kg/m2? 0  Age over 72 years old? 1  Neck circumference greater than 40 cm/16 inches? 0  Gender: 1  Obstructive Sleep Apnea Score 4  Score 4 or greater  Results sent to PCP

## 2014-05-10 NOTE — Patient Instructions (Signed)
BRADYN SOWARD  05/10/2014   Your procedure is scheduled on: Friday 05/12/14  Report to [facility] Main  Entrance and follow signs to               [place] at 05:30 AM.  Call this number if you have problems the morning of surgery [phone]   Remember:  Do not eat food or drink liquids :After Midnight.     Take these medicines the morning of surgery with A SIP OF WATER: xanax if needed, amlodipine, protonix, eye drops if needed                               You may not have any metal on your body including hair pins and              piercings  Do not wear jewelry, make-up, lotions, powders or perfumes.             Do not wear nail polish.  Do not shave  48 hours prior to surgery.              Men may shave face and neck.  Do not bring valuables to the hospital. [place].  Contacts, dentures or bridgework may not be worn into surgery.  Leave suitcase in the car. After surgery it may be brought to your room.   _____________________________________________________________________           [facility] - Preparing for Surgery Before surgery, you can play an important role.  Because skin is not sterile, your skin needs to be as free of germs as possible.  You can reduce the number of germs on your skin by washing with CHG (chlorahexidine gluconate) soap before surgery.  CHG is an antiseptic cleaner which kills germs and bonds with the skin to continue killing germs even after washing. Please DO NOT use if you have an allergy to CHG or antibacterial soaps.  If your skin becomes reddened/irritated stop using the CHG and inform your nurse when you arrive at Short Stay. Do not shave (including legs and underarms) for at least 48 hours prior to the first CHG shower.  You may shave your face/neck. Please follow these instructions carefully:  1.  Shower with CHG Soap the night before surgery and the   morning of Surgery.  2.  If you choose to wash your hair, wash your hair first as usual with your  normal  shampoo.  3.  After you shampoo, rinse your hair and body thoroughly to remove the  shampoo.                            4.  Use CHG as you would any other liquid soap.  You can apply chg directly  to the skin and wash                       Gently with a scrungie or clean washcloth.  5.  Apply the CHG Soap to your body ONLY FROM THE NECK DOWN.   Do not use on face/ open  Wound or open sores. Avoid contact with eyes, ears mouth and genitals (private parts).                       Wash face,  Genitals (private parts) with your normal soap.             6.  Wash thoroughly, paying special attention to the area where your surgery  will be performed.  7.  Thoroughly rinse your body with warm water from the neck down.  8.  DO NOT shower/wash with your normal soap after using and rinsing off  the CHG Soap.                9.  Pat yourself dry with a clean towel.            10.  Wear clean pajamas.            11.  Place clean sheets on your bed the night of your first shower and do not  sleep with pets. Day of Surgery : Do not apply any lotions/deodorants the morning of surgery.  Please wear clean clothes to the hospital/surgery center.  FAILURE TO FOLLOW THESE INSTRUCTIONS MAY RESULT IN THE CANCELLATION OF YOUR SURGERY PATIENT SIGNATURE_________________________________  NURSE SIGNATURE__________________________________  ________________________________________________________________________

## 2014-05-11 MED ORDER — BUPIVACAINE 0.25 % ON-Q PUMP DUAL CATH 300 ML
300.0000 mL | INJECTION | Status: DC
  Filled 2014-05-11: qty 300

## 2014-05-11 NOTE — Anesthesia Preprocedure Evaluation (Addendum)
Anesthesia Evaluation  Patient identified by MRN, date of birth, ID band Patient awake    Reviewed: Allergy & Precautions, H&P , NPO status , Patient's Chart, lab work & pertinent test results  Airway Mallampati: II  TM Distance: >3 FB Neck ROM: full    Dental no notable dental hx.    Pulmonary COPDCurrent Smoker,  Mild COPD breath sounds clear to auscultation  Pulmonary exam normal       Cardiovascular Exercise Tolerance: Good hypertension, Pt. on medications negative cardio ROS  + Valvular Problems/Murmurs AS Rhythm:regular Rate:Normal  Moderate AS   Neuro/Psych negative neurological ROS  negative psych ROS   GI/Hepatic negative GI ROS, Neg liver ROS, GERD-  Medicated and Controlled,  Endo/Other  negative endocrine ROSadrenal mass with no evidence of secreting action  Renal/GU negative Renal ROS  negative genitourinary   Musculoskeletal   Abdominal   Peds  Hematology negative hematology ROS (+)   Anesthesia Other Findings   Reproductive/Obstetrics negative OB ROS                            Anesthesia Physical Anesthesia Plan  ASA: III  Anesthesia Plan: General   Post-op Pain Management:    Induction: Intravenous  Airway Management Planned: Oral ETT  Additional Equipment:   Intra-op Plan:   Post-operative Plan: Extubation in OR  Informed Consent: I have reviewed the patients History and Physical, chart, labs and discussed the procedure including the risks, benefits and alternatives for the proposed anesthesia with the patient or authorized representative who has indicated his/her understanding and acceptance.   Dental Advisory Given  Plan Discussed with: CRNA and Surgeon  Anesthesia Plan Comments:         Anesthesia Quick Evaluation

## 2014-05-12 ENCOUNTER — Encounter (HOSPITAL_COMMUNITY)
Admission: RE | Disposition: A | Discharge status: Home / Self Care | Payer: Self-pay | Source: Ambulatory Visit | Attending: Surgery

## 2014-05-12 ENCOUNTER — Inpatient Hospital Stay (HOSPITAL_COMMUNITY)
Admission: RE | Admit: 2014-05-12 | Discharge: 2014-05-16 | DRG: 614 | Disposition: A | Discharge status: Home / Self Care | Payer: Medicare Other | Source: Ambulatory Visit | Attending: Surgery | Admitting: Surgery

## 2014-05-12 ENCOUNTER — Encounter (HOSPITAL_COMMUNITY): Payer: Self-pay | Admitting: *Deleted

## 2014-05-12 ENCOUNTER — Inpatient Hospital Stay (HOSPITAL_COMMUNITY): Payer: Medicare Other | Admitting: Anesthesiology

## 2014-05-12 DIAGNOSIS — E785 Hyperlipidemia, unspecified: Secondary | ICD-10-CM | POA: Diagnosis present

## 2014-05-12 DIAGNOSIS — I10 Essential (primary) hypertension: Secondary | ICD-10-CM | POA: Diagnosis present

## 2014-05-12 DIAGNOSIS — F17210 Nicotine dependence, cigarettes, uncomplicated: Secondary | ICD-10-CM | POA: Diagnosis present

## 2014-05-12 DIAGNOSIS — J449 Chronic obstructive pulmonary disease, unspecified: Secondary | ICD-10-CM | POA: Diagnosis present

## 2014-05-12 DIAGNOSIS — I739 Peripheral vascular disease, unspecified: Secondary | ICD-10-CM | POA: Diagnosis present

## 2014-05-12 DIAGNOSIS — K219 Gastro-esophageal reflux disease without esophagitis: Secondary | ICD-10-CM | POA: Diagnosis present

## 2014-05-12 DIAGNOSIS — R1900 Intra-abdominal and pelvic swelling, mass and lump, unspecified site: Secondary | ICD-10-CM

## 2014-05-12 DIAGNOSIS — E279 Disorder of adrenal gland, unspecified: Principal | ICD-10-CM | POA: Diagnosis present

## 2014-05-12 DIAGNOSIS — Z808 Family history of malignant neoplasm of other organs or systems: Secondary | ICD-10-CM

## 2014-05-12 DIAGNOSIS — E278 Other specified disorders of adrenal gland: Secondary | ICD-10-CM | POA: Diagnosis present

## 2014-05-12 DIAGNOSIS — K567 Ileus, unspecified: Secondary | ICD-10-CM | POA: Diagnosis not present

## 2014-05-12 HISTORY — DX: Intra-abdominal and pelvic swelling, mass and lump, unspecified site: R19.00

## 2014-05-12 HISTORY — PX: LAPAROSCOPIC ADRENALECTOMY: SHX999

## 2014-05-12 SURGERY — ADRENALECTOMY, LAPAROSCOPIC
Anesthesia: General | Site: Abdomen | Laterality: Left

## 2014-05-12 MED ORDER — HYDROMORPHONE HCL 1 MG/ML IJ SOLN: 0.2500 mg | INTRAMUSCULAR | Status: DC | PRN

## 2014-05-12 MED ORDER — NEOSTIGMINE METHYLSULFATE 10 MG/10ML IV SOLN
INTRAVENOUS | Status: AC
  Filled 2014-05-12: qty 1

## 2014-05-12 MED ORDER — DEXAMETHASONE SODIUM PHOSPHATE 10 MG/ML IJ SOLN
INTRAMUSCULAR | Status: DC | PRN
  Administered 2014-05-12: 10 mg via INTRAVENOUS

## 2014-05-12 MED ORDER — METOPROLOL TARTRATE 1 MG/ML IV SOLN
5.0000 mg | Freq: Four times a day (QID) | INTRAVENOUS | Status: DC | PRN
  Filled 2014-05-12 (×2): qty 5

## 2014-05-12 MED ORDER — CEFAZOLIN SODIUM-DEXTROSE 2-3 GM-% IV SOLR
INTRAVENOUS | Status: AC
  Filled 2014-05-12: qty 50

## 2014-05-12 MED ORDER — PSYLLIUM 95 % PO PACK
1.0000 | PACK | Freq: Two times a day (BID) | ORAL | Status: DC
  Administered 2014-05-12 – 2014-05-14 (×3): 1 via ORAL
  Filled 2014-05-12 (×8): qty 1

## 2014-05-12 MED ORDER — CISATRACURIUM BESYLATE 20 MG/10ML IV SOLN
INTRAVENOUS | Status: AC
  Filled 2014-05-12: qty 10

## 2014-05-12 MED ORDER — CISATRACURIUM BESYLATE (PF) 10 MG/5ML IV SOLN
INTRAVENOUS | Status: DC | PRN
  Administered 2014-05-12: 4 mg via INTRAVENOUS
  Administered 2014-05-12: 2 mg via INTRAVENOUS
  Administered 2014-05-12 (×3): 4 mg via INTRAVENOUS
  Administered 2014-05-12: 8 mg via INTRAVENOUS
  Administered 2014-05-12 (×2): 4 mg via INTRAVENOUS

## 2014-05-12 MED ORDER — PANTOPRAZOLE SODIUM 40 MG PO TBEC
40.0000 mg | DELAYED_RELEASE_TABLET | Freq: Every day | ORAL | Status: DC
  Administered 2014-05-13 – 2014-05-14 (×2): 40 mg via ORAL
  Filled 2014-05-12 (×4): qty 1

## 2014-05-12 MED ORDER — DIPHENHYDRAMINE HCL 25 MG PO CAPS: 25.0000 mg | ORAL_CAPSULE | Freq: Four times a day (QID) | ORAL | Status: DC | PRN

## 2014-05-12 MED ORDER — METOPROLOL TARTRATE 12.5 MG HALF TABLET
12.5000 mg | ORAL_TABLET | Freq: Two times a day (BID) | ORAL | Status: DC | PRN
  Filled 2014-05-12: qty 1

## 2014-05-12 MED ORDER — BUPIVACAINE-EPINEPHRINE (PF) 0.25% -1:200000 IJ SOLN
INTRAMUSCULAR | Status: AC
  Filled 2014-05-12: qty 30

## 2014-05-12 MED ORDER — DEXAMETHASONE SODIUM PHOSPHATE 10 MG/ML IJ SOLN
INTRAMUSCULAR | Status: AC
  Filled 2014-05-12: qty 1

## 2014-05-12 MED ORDER — SACCHAROMYCES BOULARDII 250 MG PO CAPS
250.0000 mg | ORAL_CAPSULE | Freq: Two times a day (BID) | ORAL | Status: DC
  Administered 2014-05-12 – 2014-05-15 (×8): 250 mg via ORAL
  Filled 2014-05-12 (×10): qty 1

## 2014-05-12 MED ORDER — BUPIVACAINE-EPINEPHRINE 0.25% -1:200000 IJ SOLN
INTRAMUSCULAR | Status: AC
  Filled 2014-05-12: qty 1

## 2014-05-12 MED ORDER — FENTANYL CITRATE 0.05 MG/ML IJ SOLN
INTRAMUSCULAR | Status: DC | PRN
  Administered 2014-05-12: 100 ug via INTRAVENOUS
  Administered 2014-05-12: 50 ug via INTRAVENOUS
  Administered 2014-05-12: 100 ug via INTRAVENOUS
  Administered 2014-05-12 (×3): 50 ug via INTRAVENOUS
  Administered 2014-05-12: 100 ug via INTRAVENOUS

## 2014-05-12 MED ORDER — HYDRALAZINE HCL 20 MG/ML IJ SOLN
INTRAMUSCULAR | Status: AC
  Filled 2014-05-12: qty 1

## 2014-05-12 MED ORDER — HYDROMORPHONE HCL 1 MG/ML IJ SOLN
INTRAMUSCULAR | Status: DC | PRN
  Administered 2014-05-12 (×4): 0.5 mg via INTRAVENOUS

## 2014-05-12 MED ORDER — ALPRAZOLAM 0.5 MG PO TABS
0.5000 mg | ORAL_TABLET | Freq: Three times a day (TID) | ORAL | Status: DC | PRN
  Administered 2014-05-13 – 2014-05-15 (×4): 0.5 mg via ORAL
  Filled 2014-05-12 (×4): qty 1

## 2014-05-12 MED ORDER — CEFAZOLIN SODIUM-DEXTROSE 2-3 GM-% IV SOLR
2.0000 g | Freq: Three times a day (TID) | INTRAVENOUS | Status: DC
  Administered 2014-05-12 – 2014-05-16 (×12): 2 g via INTRAVENOUS
  Filled 2014-05-12 (×12): qty 50

## 2014-05-12 MED ORDER — HYDRALAZINE HCL 20 MG/ML IJ SOLN
INTRAMUSCULAR | Status: DC | PRN
  Administered 2014-05-12 (×2): 5 mg via INTRAVENOUS

## 2014-05-12 MED ORDER — ACETAMINOPHEN 500 MG PO TABS
1000.0000 mg | ORAL_TABLET | Freq: Three times a day (TID) | ORAL | Status: DC
Start: 2014-05-12 — End: 2014-05-16
  Administered 2014-05-12 – 2014-05-15 (×5): 1000 mg via ORAL
  Filled 2014-05-12 (×14): qty 2

## 2014-05-12 MED ORDER — BUPIVACAINE 0.25 % ON-Q PUMP DUAL CATH 300 ML: 300.0000 mL | INJECTION | Status: DC

## 2014-05-12 MED ORDER — HYDROMORPHONE HCL 1 MG/ML IJ SOLN
0.5000 mg | INTRAMUSCULAR | Status: DC | PRN
  Administered 2014-05-12 (×2): 1 mg via INTRAVENOUS
  Administered 2014-05-12: 2 mg via INTRAVENOUS
  Administered 2014-05-12 (×2): 1 mg via INTRAVENOUS
  Administered 2014-05-13 (×3): 1.5 mg via INTRAVENOUS
  Administered 2014-05-13 – 2014-05-15 (×8): 2 mg via INTRAVENOUS
  Filled 2014-05-12 (×10): qty 2
  Filled 2014-05-12 (×4): qty 1
  Filled 2014-05-12 (×2): qty 2

## 2014-05-12 MED ORDER — MENTHOL 3 MG MT LOZG
1.0000 | LOZENGE | OROMUCOSAL | Status: DC | PRN
  Filled 2014-05-12: qty 9

## 2014-05-12 MED ORDER — BUPIVACAINE-EPINEPHRINE 0.25% -1:200000 IJ SOLN
INTRAMUSCULAR | Status: DC | PRN
  Administered 2014-05-12: 80 mL

## 2014-05-12 MED ORDER — LACTATED RINGERS IV SOLN: INTRAVENOUS | Status: DC

## 2014-05-12 MED ORDER — PROMETHAZINE HCL 25 MG/ML IJ SOLN: 6.2500 mg | INTRAMUSCULAR | Status: DC | PRN

## 2014-05-12 MED ORDER — ONDANSETRON HCL 4 MG/2ML IJ SOLN
INTRAMUSCULAR | Status: AC
  Filled 2014-05-12: qty 2

## 2014-05-12 MED ORDER — HEPARIN SODIUM (PORCINE) 5000 UNIT/ML IJ SOLN
5000.0000 [IU] | Freq: Three times a day (TID) | INTRAMUSCULAR | Status: DC
  Administered 2014-05-13 – 2014-05-16 (×10): 5000 [IU] via SUBCUTANEOUS
  Filled 2014-05-12 (×15): qty 1

## 2014-05-12 MED ORDER — FENTANYL CITRATE 0.05 MG/ML IJ SOLN
INTRAMUSCULAR | Status: AC
  Filled 2014-05-12: qty 5

## 2014-05-12 MED ORDER — ACETAMINOPHEN 10 MG/ML IV SOLN
1000.0000 mg | Freq: Once | INTRAVENOUS | Status: AC
  Administered 2014-05-12: 1000 mg via INTRAVENOUS
  Filled 2014-05-12: qty 100

## 2014-05-12 MED ORDER — HYDROMORPHONE HCL 2 MG/ML IJ SOLN
INTRAMUSCULAR | Status: AC
  Filled 2014-05-12: qty 1

## 2014-05-12 MED ORDER — LIP MEDEX EX OINT
1.0000 "application " | TOPICAL_OINTMENT | Freq: Two times a day (BID) | CUTANEOUS | Status: DC
  Administered 2014-05-12 – 2014-05-15 (×8): 1 via TOPICAL
  Filled 2014-05-12: qty 7

## 2014-05-12 MED ORDER — BUPIVACAINE 0.25 % ON-Q PUMP DUAL CATH 300 ML
INJECTION | Status: DC | PRN
  Administered 2014-05-12: 300 mL

## 2014-05-12 MED ORDER — POLYVINYL ALCOHOL 1.4 % OP SOLN
1.0000 [drp] | Freq: Three times a day (TID) | OPHTHALMIC | Status: DC | PRN
  Filled 2014-05-12: qty 15

## 2014-05-12 MED ORDER — ETODOLAC 400 MG PO TABS
400.0000 mg | ORAL_TABLET | Freq: Two times a day (BID) | ORAL | Status: DC | PRN
  Filled 2014-05-12: qty 1

## 2014-05-12 MED ORDER — PROPOFOL 10 MG/ML IV BOLUS
INTRAVENOUS | Status: DC | PRN
  Administered 2014-05-12: 150 mg via INTRAVENOUS

## 2014-05-12 MED ORDER — PHENOL 1.4 % MT LIQD
2.0000 | OROMUCOSAL | Status: DC | PRN
  Filled 2014-05-12: qty 177

## 2014-05-12 MED ORDER — ONDANSETRON 8 MG/NS 50 ML IVPB
8.0000 mg | Freq: Four times a day (QID) | INTRAVENOUS | Status: DC | PRN
  Filled 2014-05-12: qty 8

## 2014-05-12 MED ORDER — GLYCOPYRROLATE 0.2 MG/ML IJ SOLN
INTRAMUSCULAR | Status: AC
  Filled 2014-05-12: qty 3

## 2014-05-12 MED ORDER — SUCCINYLCHOLINE CHLORIDE 20 MG/ML IJ SOLN
INTRAMUSCULAR | Status: DC | PRN
  Administered 2014-05-12: 100 mg via INTRAVENOUS

## 2014-05-12 MED ORDER — 0.9 % SODIUM CHLORIDE (POUR BTL) OPTIME
TOPICAL | Status: DC | PRN
  Administered 2014-05-12: 2000 mL

## 2014-05-12 MED ORDER — POLYETHYLENE GLYCOL 3350 17 G PO PACK: 17.0000 g | PACK | Freq: Two times a day (BID) | ORAL | Status: DC | PRN

## 2014-05-12 MED ORDER — BISACODYL 10 MG RE SUPP
10.0000 mg | Freq: Two times a day (BID) | RECTAL | Status: DC | PRN
  Administered 2014-05-13 – 2014-05-14 (×2): 10 mg via RECTAL
  Filled 2014-05-12 (×2): qty 1

## 2014-05-12 MED ORDER — PROPOFOL 10 MG/ML IV BOLUS
INTRAVENOUS | Status: AC
  Filled 2014-05-12: qty 20

## 2014-05-12 MED ORDER — ADULT MULTIVITAMIN W/MINERALS CH
1.0000 | ORAL_TABLET | Freq: Every day | ORAL | Status: DC
  Administered 2014-05-12 – 2014-05-15 (×4): 1 via ORAL
  Filled 2014-05-12 (×6): qty 1

## 2014-05-12 MED ORDER — ONDANSETRON HCL 4 MG/2ML IJ SOLN
4.0000 mg | Freq: Four times a day (QID) | INTRAMUSCULAR | Status: DC | PRN
Start: 2014-05-12 — End: 2014-05-16

## 2014-05-12 MED ORDER — HYDROCODONE-ACETAMINOPHEN 5-325 MG PO TABS
1.0000 | ORAL_TABLET | ORAL | Status: DC | PRN
Start: 2014-05-12 — End: 2014-05-15
  Administered 2014-05-13 – 2014-05-15 (×8): 2 via ORAL
  Filled 2014-05-12 (×8): qty 2

## 2014-05-12 MED ORDER — LACTATED RINGERS IV SOLN
INTRAVENOUS | Status: DC
  Administered 2014-05-12 – 2014-05-13 (×2): via INTRAVENOUS

## 2014-05-12 MED ORDER — DIPHENHYDRAMINE HCL 50 MG/ML IJ SOLN: 12.5000 mg | Freq: Four times a day (QID) | INTRAMUSCULAR | Status: DC | PRN

## 2014-05-12 MED ORDER — LACTATED RINGERS IR SOLN
Status: DC | PRN
  Administered 2014-05-12: 1

## 2014-05-12 MED ORDER — ALUM & MAG HYDROXIDE-SIMETH 200-200-20 MG/5ML PO SUSP
30.0000 mL | Freq: Four times a day (QID) | ORAL | Status: DC | PRN
  Administered 2014-05-13 – 2014-05-14 (×4): 30 mL via ORAL
  Filled 2014-05-12 (×4): qty 30

## 2014-05-12 MED ORDER — MAGIC MOUTHWASH
15.0000 mL | Freq: Four times a day (QID) | ORAL | Status: DC | PRN
  Filled 2014-05-12: qty 15

## 2014-05-12 MED ORDER — LACTATED RINGERS IV BOLUS (SEPSIS): 1000.0000 mL | Freq: Three times a day (TID) | INTRAVENOUS | Status: AC | PRN

## 2014-05-12 MED ORDER — CEFAZOLIN SODIUM-DEXTROSE 2-3 GM-% IV SOLR
2.0000 g | INTRAVENOUS | Status: AC
  Administered 2014-05-12: 2 g via INTRAVENOUS

## 2014-05-12 MED ORDER — GLYCOPYRROLATE 0.2 MG/ML IJ SOLN
INTRAMUSCULAR | Status: DC | PRN
  Administered 2014-05-12: 0.6 mg via INTRAVENOUS

## 2014-05-12 MED ORDER — ONDANSETRON HCL 4 MG/2ML IJ SOLN
INTRAMUSCULAR | Status: DC | PRN
  Administered 2014-05-12: 4 mg via INTRAVENOUS

## 2014-05-12 MED ORDER — NEOSTIGMINE METHYLSULFATE 10 MG/10ML IV SOLN
INTRAVENOUS | Status: DC | PRN
  Administered 2014-05-12: 4 mg via INTRAVENOUS

## 2014-05-12 MED ORDER — ETODOLAC 200 MG PO CAPS
400.0000 mg | ORAL_CAPSULE | Freq: Two times a day (BID) | ORAL | Status: DC | PRN
  Filled 2014-05-12: qty 2

## 2014-05-12 MED ORDER — LACTATED RINGERS IV SOLN
INTRAVENOUS | Status: DC | PRN
  Administered 2014-05-12 (×3): via INTRAVENOUS

## 2014-05-12 SURGICAL SUPPLY — 75 items
APPLIER CLIP 5 13 M/L LIGAMAX5 (MISCELLANEOUS) ×4
APPLIER CLIP ROT 10 11.4 M/L (STAPLE)
APR CLP MED LRG 11.4X10 (STAPLE)
APR CLP MED LRG 5 ANG JAW (MISCELLANEOUS) ×2
BLADE EXTENDED COATED 6.5IN (ELECTRODE) IMPLANT
BLADE SURG SZ10 CARB STEEL (BLADE) ×2 IMPLANT
CLIP APPLIE 5 13 M/L LIGAMAX5 (MISCELLANEOUS) IMPLANT
CLIP APPLIE ROT 10 11.4 M/L (STAPLE) IMPLANT
COVER SURGICAL LIGHT HANDLE (MISCELLANEOUS) ×4 IMPLANT
DECANTER SPIKE VIAL GLASS SM (MISCELLANEOUS) ×2 IMPLANT
DRAIN CHANNEL 19F RND (DRAIN) IMPLANT
DRAPE UTILITY XL STRL (DRAPES) IMPLANT
DRAPE WARM FLUID 44X44 (DRAPE) ×2 IMPLANT
DRSG OPSITE POSTOP 4X10 (GAUZE/BANDAGES/DRESSINGS) ×1 IMPLANT
ELECT REM PT RETURN 9FT ADLT (ELECTROSURGICAL) ×2
ELECTRODE REM PT RTRN 9FT ADLT (ELECTROSURGICAL) ×1 IMPLANT
EVACUATOR SILICONE 100CC (DRAIN) IMPLANT
GAUZE SPONGE 2X2 8PLY STRL LF (GAUZE/BANDAGES/DRESSINGS) IMPLANT
GLOVE BIO SURGEON STRL SZ 6 (GLOVE) ×2 IMPLANT
GLOVE BIO SURGEON STRL SZ 6.5 (GLOVE) ×2 IMPLANT
GLOVE BIOGEL PI IND STRL 7.0 (GLOVE) ×2 IMPLANT
GLOVE BIOGEL PI INDICATOR 7.0 (GLOVE) ×2
GLOVE ECLIPSE 8.0 STRL XLNG CF (GLOVE) ×2 IMPLANT
GLOVE INDICATOR 6.5 STRL GRN (GLOVE) ×2 IMPLANT
GLOVE INDICATOR 8.0 STRL GRN (GLOVE) ×1 IMPLANT
GOWN STRL REUS W/TWL XL LVL3 (GOWN DISPOSABLE) ×6 IMPLANT
KIT BASIN OR (CUSTOM PROCEDURE TRAY) ×2 IMPLANT
LIGASURE IMPACT 36 18CM CVD LR (INSTRUMENTS) IMPLANT
NEEDLE HYPO 22GX1.5 SAFETY (NEEDLE) ×1 IMPLANT
PACK UNIVERSAL I (CUSTOM PROCEDURE TRAY) ×2 IMPLANT
PENCIL BUTTON HOLSTER BLD 10FT (ELECTRODE) ×2 IMPLANT
RELOAD STAPLE 60 2.6 WHT THN (STAPLE) IMPLANT
RELOAD STAPLE 60 3.6 BLU REG (STAPLE) IMPLANT
RELOAD STAPLER BLUE 60MM (STAPLE) IMPLANT
RELOAD STAPLER WHITE 60MM (STAPLE) IMPLANT
SCISSORS LAP 5X35 DISP (ENDOMECHANICALS) ×2 IMPLANT
SET IRRIG TUBING LAPAROSCOPIC (IRRIGATION / IRRIGATOR) ×2 IMPLANT
SHEARS HARMONIC ACE PLUS 36CM (ENDOMECHANICALS) IMPLANT
SLEEVE SURGEON STRL (DRAPES) ×1 IMPLANT
SLEEVE XCEL OPT CAN 5 100 (ENDOMECHANICALS) ×5 IMPLANT
SOLUTION ANTI FOG 6CC (MISCELLANEOUS) ×2 IMPLANT
SPONGE GAUZE 2X2 STER 10/PKG (GAUZE/BANDAGES/DRESSINGS) ×1
SPONGE LAP 18X18 X RAY DECT (DISPOSABLE) ×2 IMPLANT
STAPLE ECHEON FLEX 60 POW ENDO (STAPLE) IMPLANT
STAPLER RELOAD BLUE 60MM (STAPLE)
STAPLER RELOAD WHITE 60MM (STAPLE)
STAPLER VISISTAT 35W (STAPLE) ×2 IMPLANT
SUCTION POOLE TIP (SUCTIONS) ×2 IMPLANT
SUT PDS AB 1 CT1 27 (SUTURE) IMPLANT
SUT PDS AB 1 CTX 36 (SUTURE) IMPLANT
SUT PROLENE 2 0 KS (SUTURE) IMPLANT
SUT SILK 2 0 (SUTURE)
SUT SILK 2 0 SH CR/8 (SUTURE) IMPLANT
SUT SILK 2-0 18XBRD TIE 12 (SUTURE) IMPLANT
SUT SILK 3 0 (SUTURE)
SUT SILK 3 0 SH CR/8 (SUTURE) IMPLANT
SUT SILK 3-0 18XBRD TIE 12 (SUTURE) IMPLANT
SUT VIC AB 2-0 CT1 27 (SUTURE)
SUT VIC AB 2-0 CT1 27XBRD (SUTURE) IMPLANT
SUT VIC AB 3-0 PS2 18 (SUTURE)
SUT VIC AB 3-0 PS2 18XBRD (SUTURE) IMPLANT
SUT VIC AB 4-0 SH 18 (SUTURE) IMPLANT
SUT VICRYL 0 UR6 27IN ABS (SUTURE) IMPLANT
SYRINGE 20CC LL (MISCELLANEOUS) ×2 IMPLANT
SYS LAPSCP GELPORT 120MM (MISCELLANEOUS) ×2
SYSTEM LAPSCP GELPORT 120MM (MISCELLANEOUS) IMPLANT
TOWEL OR 17X26 10 PK STRL BLUE (TOWEL DISPOSABLE) ×2 IMPLANT
TOWEL OR NON WOVEN STRL DISP B (DISPOSABLE) ×2 IMPLANT
TRAY FOLEY CATH 14FRSI W/METER (CATHETERS) ×2 IMPLANT
TROCAR BLADELESS OPT 12M 100M (ENDOMECHANICALS) ×1 IMPLANT
TROCAR BLADELESS OPT 5 100 (ENDOMECHANICALS) ×2 IMPLANT
TROCAR XCEL BLUNT TIP 100MML (ENDOMECHANICALS) IMPLANT
TROCAR XCEL NON-BLD 11X100MML (ENDOMECHANICALS) IMPLANT
TUBING FILTER THERMOFLATOR (ELECTROSURGICAL) ×2 IMPLANT
YANKAUER SUCT BULB TIP 10FT TU (MISCELLANEOUS) ×2 IMPLANT

## 2014-05-12 NOTE — Anesthesia Procedure Notes (Addendum)
Procedure Name: Intubation Date/Time: 05/12/2014 7:49 AM Performed by: Dione Booze Pre-anesthesia Checklist: Patient identified, Emergency Drugs available, Suction available and Patient being monitored Patient Re-evaluated:Patient Re-evaluated prior to inductionOxygen Delivery Method: Circle System Utilized and Circle system utilized Preoxygenation: Pre-oxygenation with 100% oxygen Intubation Type: IV induction Laryngoscope Size: Mac and 4 Grade View: Grade II Tube type: Oral (taperguard) Tube size: 7.5 mm Number of attempts: 1 Airway Equipment and Method: Stylet and Oral airway Placement Confirmation: ETT inserted through vocal cords under direct vision,  positive ETCO2 and breath sounds checked- equal and bilateral (poor dentition) Secured at: 21 cm Tube secured with: Tape Dental Injury: Teeth and Oropharynx as per pre-operative assessment

## 2014-05-12 NOTE — Op Note (Addendum)
05/12/2014  12:10 PM  PATIENT:  Jacob Christian  74 y.o. male  Patient Care Team: Donnie Coffin, MD as PCP - General (Family Medicine)  PRE-OPERATIVE DIAGNOSIS:  Left Adrenal Mass  POST-OPERATIVE DIAGNOSIS:  Left Adrenal/Retroperitoneal mass  PROCEDURE:    LAPAROSCOPIC ASSISTED RESECTION OF RETROPERITONEAL MASS WITH LEFT ADRENALECTOMY  SURGEON:  Surgeon(s): Michael Boston, MD Leighton Ruff, MD  - Assist  ANESTHESIA:   local and general  EBL:  Total I/O In: 2000 [I.V.:2000] Out: 1300 [Urine:875; Drains:25; Blood:400]  Delay start of Pharmacological VTE agent (>24hrs) due to surgical blood loss or risk of bleeding:  no  DRAINS: (19 Fr) Blake drain(s) in the LUQ retroperitoneum   SPECIMEN:  Source of Specimen:  1.  Left adrenal/retroperitonel mass  2.  Left renal hilar mass ? lymph node  DISPOSITION OF SPECIMEN:  PATHOLOGY  COUNTS:  YES  PLAN OF CARE: Admit to inpatient   PATIENT DISPOSITION:  PACU - hemodynamically stable.  INDICATION:   Patient with a large mass on left adrenal gland.  Workup negative for obvious pheochromocytoma.  15 cm size.  Recommendation made for removal.  Minimally invasive assisted with low threshold to convert to open.  Cardiac clearance done.  He agreed to proceed:  The anatomy and physiology of the adrenal gland was discussed.  Pathophysiology of the adrenal problem was discussed.  Options were discussed, and I made a recommendation to remove the adrenal gland & to treat the pathology.  Minimally invasive & open techniques discussed.  Risks of bleeding, infection, injury to other organs, reoperation, death, and other risks were discussed.   I noted a good likelihood this will help address the problem.  While there are risks, I feel the risks of nonoperative management are greater; therefore, I feel surgery offers the best option. Educational material was available.  We will work to minimize complications.    OR FINDINGS:   Large spheroid  well encapsulated retroperitoneal mass adherent to/ ? coming off superomedial normal size adrenal gland.  15 cm diameter.  No evidence of carcinomatosis.  No dense involvement with other organs.  DESCRIPTION:   Informed symptoms confirmed.  Patient had preoperative cardiac clearance and also negative workup to rule out active hormonal function.  He underwent general she is without difficulty.  He was positioned in partial right side down decubitus at 45 with a beanbag and carefully positioned.  Abdomen was prepped and draped in sterile fashion.  Surgical timeout confirmed our plan.  I placed a 5 mm through an infraumbilical curvilinear incision using a modified Hassan cutdown technique.  Entry was clean.  I induced carbon dioxide insufflation.  Camera inspection revealed no injury.  Obvious large retroperitoneal mass in the left upper quadrant and left mid abdomen.  Placed 5 ports on the right side and left lower quadrant.  Mass was seen pushing through the splenic flexure mesocolon.  That was extremely thinned out.  Felt it was not able to roll the colon away.  Therefore we opened up the mesocolon with the Harmonic scalpel in a radial fashion from the ligament of Treitz towards the splenic flexure, sparing the left middle colic branches and the left colic branches.  Eventually freed off across the mass.  Then began to free the mass off the retroperitoneum on the inferior edge.  Came to Gerota's fascia.  Took anterior Gerota's fascia off the kidney.  Tried coming medially but the mass was very rolled over.  Cannot easily roll it.  I therefore placed  a hand GelPort through a upper midline incision.  With that I could more softly mobilize the mass.  Freed adhesions off medially and inferiorly.  Freed the most and mesocolon off medially and superiorly as well.  Because visualization was poor, switched to mobilize the lateral to medial fashion.  Came around caphalad to the left upper quadrant retroperitoneum to  roll off anterior Gerota's fascia off the kidney completely.  With that I could better mobilize the mass back to a medial to lateral fashion.  I could find the dominant left renal vein.  It was densely adherent to the mass posteriorly.  I carefully freed the left renal vein off the mass using blunt dissection and or harmonic dissection.  Small perforating branches were controlled with clips proximally.  There were some small arterial radicle suspicious for small adrenal branches to the medial part of the mass.  These were carefully skeletonized with good critical views and ligated with clips on the retroperitoneal side and harmonic dissection on the specimen side.  Found a dominant vein to the mass medially and laterally.  I isolated the medial vein heading clearly into the mass from the retroperitoneum.  Placed 3 clips proximally 2 clips on the specimen transected with Harmonic.  Rolled over the mass medially to find a similar vein on the lateral side, clearly coming off the left renal vein.  We skeletonized well and ligated in a similar fashion.    With that I had much better mobility.  We freed off the final adhesions along the superior ridge just inferior and posterior to the pancreas.  Finally encountered the adrenal gland as it was on the dome of the superomedial kidney and rolled down towards the retroperitoneum.  I couldsee and feel the splenic artery and vein.  Were able to stay inferior to that.  Eventually completely mobilized the mass.  Could see the pancreas intact and superior to all of this.  There are small retroperitoneal venous radicals that were controlled with clips in 2 places.  Irrigated copiously.  Hemostasis was good.  I remove the mass through the Gelport.  Did have to extend incision to 15 cm to finally get the mass out intact.  We did copious irrigation and ensured that hemostasis was good in the retroperitoneum.  We inspected the colon.  Splenic flexure was viable.  No evidence of  ischemia.  Because the mesocolon defect was wide open and in the left colon retroperitoneal adhesions were intact, we decided not to close the defect.  I placed a 71 [place] Blake drain through the left lower quadrant 5 mm port site into that retrocolic retroperitoneal space.  Allowed the transverse & splenic flexure mesocolon to lay over it.  Freddrick March some greater omental adhesions to the left upper quadrant & all that to roll down more easily.  Spleen pancreas liver stomach clean and intact.  Ligament of Treitz had been rolled away but returned to natural position intact.  No evidence of bleeding along the aorta.  Renal vein and arterial transection sites good intact.  We did copious irrigation.  Hemostasis good.  Placed On-Q tunnelers along the subcostal ridges.  Closed the midline with running #1 PDS suture to good result.  Skin closed with 4 Monocryl suture.  Sterile dressings applied.  Stitch secured the drain.  Patient sent to PACU recovery room in good condition.  He had mild hypertension at start of the case but no significant hemodynamic abnormalities.  Vital signs good.  Mild tachycardia when  alert but then improved with pain medicine.  It is reasonable for and be followed closely.  Discussed intraoperative findings and postoperative recommendations with the patient's family.  Questions answered.  They expressed understanding and appreciation.    Adin Hector, M.D., F.A.C.S. Gastrointestinal and Minimally Invasive Surgery [facility] [address], [place], [place] [address] [phone] Main / Paging

## 2014-05-12 NOTE — Interval H&P Note (Signed)
History and Physical Interval Note:  05/12/2014 7:12 AM  Jacob Christian  has presented today for surgery, with the diagnosis of Left Adrenal Mass  The various methods of treatment have been discussed with the patient and family. After consideration of risks, benefits and other options for treatment, the patient has consented to  Procedure(s): LAPAROSCOPIC LEFT ADRENALECTOMY (Left) as a surgical intervention .  The patient's history has been reviewed, patient examined, no change in status, stable for surgery.  I have reviewed the patient's chart and labs.  Questions were answered to the patient's satisfaction.     Woodie Degraffenreid C.

## 2014-05-12 NOTE — Transfer of Care (Signed)
Immediate Anesthesia Transfer of Care Note  Patient: Jacob Christian  Procedure(s) Performed: Procedure(s): LAPAROSCOPIC LEFT ADRENALECTOMY (Left)  Patient Location: PACU  Anesthesia Type:General  Level of Consciousness: awake, alert , oriented and patient cooperative  Airway & Oxygen Therapy: Patient Spontanous Breathing and Patient connected to face mask oxygen  Post-op Assessment: Report given to RN and Post -op Vital signs reviewed and stable  Post vital signs: Reviewed and stable  Last Vitals:  Filed Vitals:   05/12/14 0535  BP: 152/61  Pulse: 63  Temp: 37 C  Resp: 18    Complications: No apparent anesthesia complications

## 2014-05-12 NOTE — Anesthesia Postprocedure Evaluation (Signed)
  Anesthesia Post-op Note  Patient: Jacob Christian  Procedure(s) Performed: Procedure(s) (LRB): LAPAROSCOPIC LEFT ADRENALECTOMY (Left)  Patient Location: PACU  Anesthesia Type: General  Level of Consciousness: awake and alert   Airway and Oxygen Therapy: Patient Spontanous Breathing  Post-op Pain: mild  Post-op Assessment: Post-op Vital signs reviewed, Patient's Cardiovascular Status Stable, Respiratory Function Stable, Patent Airway and No signs of Nausea or vomiting  Last Vitals:  Filed Vitals:   05/12/14 1301  BP: 164/76  Pulse: 100  Temp: 36.6 C  Resp: 15    Post-op Vital Signs: stable   Complications: No apparent anesthesia complications

## 2014-05-12 NOTE — H&P (View-Only) (Signed)
Jacob Christian 02/01/2014 11:01 AM Location: [facility] Patient #: [id number] DOB: 1940/06/02 Single / Language: Jacob Christian / Race: White Male  History of Present Illness   Patient words: Adrenal mass.  The patient is a 74 year old male who presents wtih an adrenal mass. Pleasant patient. Comes today with his daughter. Tends to avoid doctors. Heavy smoker. History of falls with chronic left shoulder and chest wall pain. Some arthritis with right knee pain. Moderately active. Had some discomfort. Mass felt in abdomen by Dr Alroy Dust on exam. Underwent CT scan. Large mass found on left kidney suspicious for adrenal tumor. Surgical consultation requested. He denies much abdominal pain. Has bowel movements every day. Never had a colonoscopy. Continues to smoke. Been told to consider colonoscopy in quit smoking. Still thinking about it. Daughter thinks he has some mild shortness of breath with activity but not severe. The patient was referred by a primary care provider. Initial presentation was 3 week(s) ago. Presentation included pain. Past evaluation has included CT. Symptoms include asymptomatic. Symptoms are located in the left adrenal. The mass is described as smooth. Onset was sudden. The patient is not currently being treated for this problem. Risk factors do not include impaired immunity, radiation for other malignancy or environmental radiation exposure. Pertinent family history does not include autoimmune disease, familial adenomatous polyposis, multiple endocrine neoplasia type 2 or pheochromocytoma.   Other Problems Jacob Christian, CMA; 02/01/2014 11:01 AM) Anxiety Disorder Arthritis Gastroesophageal Reflux Disease High blood pressure  Past Surgical History Jacob Christian, CMA; 02/01/2014 11:01 AM) Knee Surgery Right.  Diagnostic Studies History Jacob Hector, MD; 02/01/2014 3:38 PM) Colonoscopy never Echocardiogram10/14/2015 Transthoracic  Echocardiography Patient: Jacob Christian, Jacob Christian MR #: [id number] Study Date: 01/25/2014 Gender: M Age: 37 Height: 180.3 cm Weight: 81.2 kg BSA: 2.02 m^2 Pt. Status: Room: ATTENDING Jacob Christian, M.D. Jacob Christian Jacob Christian, RDCS ORDERING Jacob Christian, Jacob Christian PERFORMING Jacob Christian, Jacob Christian: ------------------------------------------------------------------- LV EF: 60% - 65% ------------------------------------------------------------------- Indications: Aortic valve disease (I35.9). ------------------------------------------------------------------- History: Risk factors: Arthritis. GERD. Anxiety. Osteoarthritis. Current tobacco use. Hypertension. ------------------------------------------------------------------- Study Conclusions - Left ventricle: The cavity size was normal. Wall thickness was increased in a pattern of mild LVH. Systolic function was normal. The estimated ejection fraction was in the range of 60% to 65%. Wall motion was normal; there were no regional wall motion abnormalities. Doppler parameters are consistent with abnormal left ventricular relaxation (grade 1 diastolic dysfunction). - Aortic valve: Mildly calcified annulus. Moderately thickened, moderately calcified leaflets. There was mild to moderate stenosis. Valve area (VTI): 1.12 cm^2. Valve area (Vmean): 0.97 cm^2. - Left atrium: The atrium was mildly to moderately dilated. - Right atrium: The atrium was mildly to moderately dilated. - Pulmonary arteries: Systolic pressure was mildly increased. PA peak pressure: 35 mm Hg (S).  Allergies Jacob Christian, CMA; 02/01/2014 11:03 AM) Codeine/Codeine Derivatives  Medication History Jacob Christian, CMA; 02/01/2014 11:04 AM) AmLODIPine Besylate (5MG  Tablet, Oral) Active. Lisinopril (40MG  Tablet, Oral) Active. Pantoprazole Sodium (40MG  Tablet DR, Oral) Active. Etodolac (400MG  Tablet, Oral) Active. Hydrocodone-Acetaminophen (5-325MG  Tablet, Oral) Active.  Social  History Jacob Christian, [place]; 02/01/2014 11:01 AM) Alcohol use Occasional alcohol use. Caffeine use Carbonated beverages, Coffee, Tea. No drug use Tobacco use Current every day smoker.  Family History Jacob Christian, [place]; 02/01/2014 11:01 AM) Cancer Mother. Heart Disease Father. Hypertension Father. Prostate Cancer Brother.  Review of Systems Jacob Christian CMA; 02/01/2014 11:01 AM) General Not Present- Appetite Loss, Chills, Fatigue, Fever, Night Sweats, Weight Gain and Weight Loss. Skin Not  Present- Change in Wart/Mole, Dryness, Hives, Jaundice, New Lesions, Non-Healing Wounds, Rash and Ulcer. HEENT Present- Hearing Loss and Wears glasses/contact lenses. Not Present- Earache, Hoarseness, Nose Bleed, Oral Ulcers, Ringing in the Ears, Seasonal Allergies, Sinus Pain, Sore Throat, Visual Disturbances and Yellow Eyes. Cardiovascular Present- Leg Cramps and Shortness of Breath. Not Present- Chest Pain, Difficulty Breathing Lying Down, Palpitations, Rapid Heart Rate and Swelling of Extremities. Gastrointestinal Not Present- Abdominal Pain, Bloating, Bloody Stool, Change in Bowel Habits, Chronic diarrhea, Constipation, Difficulty Swallowing, Excessive gas, Gets full quickly at meals, Hemorrhoids, Indigestion, Nausea, Rectal Pain and Vomiting. Male Genitourinary Not Present- Blood in Urine, Change in Urinary Stream, Frequency, Impotence, Nocturia, Painful Urination, Urgency and Urine Leakage. Neurological Not Present- Decreased Memory, Fainting, Headaches, Numbness, Seizures, Tingling, Tremor, Trouble walking and Weakness. Psychiatric Present- Anxiety. Not Present- Bipolar, Change in Sleep Pattern, Depression, Fearful and Frequent crying. Endocrine Not Present- Cold Intolerance, Excessive Hunger, Hair Changes, Heat Intolerance, Hot flashes and New Diabetes. Hematology Not Present- Easy Bruising, Excessive bleeding, Gland problems, HIV and Persistent Infections.   Vitals Jacob Christian CMA; 02/01/2014 11:05 AM) 02/01/2014 11:04 AM Weight: 181 lb Height: 69in Body Surface Area: 2 m Body Mass Index: 26.73 kg/m Temp.: 97.19F  Pulse: 63 (Regular)  BP: 160/80 (Sitting, Left Arm, Standard)    Physical Exam Jacob Hector MD; 02/01/2014 11:55 AM) General Mental Status-Alert. General Appearance-Not in acute distress, Not Sickly. Orientation-Oriented X3. Hydration-Well hydrated. Voice-Normal.  Integumentary Global Assessment Upon inspection and palpation of skin surfaces of the - Axillae: non-tender, no inflammation or ulceration, no drainage. and Distribution of scalp and body hair is normal. General Characteristics Temperature - normal warmth is noted.  Head and Neck Head-normocephalic, atraumatic with no lesions or palpable masses. Face Global Assessment - atraumatic, no absence of expression. Neck Global Assessment - no abnormal movements, no bruit auscultated on the right, no bruit auscultated on the left, no decreased range of motion, non-tender. Trachea-midline. Thyroid Gland Characteristics - non-tender.  Eye Eyeball - Left-Extraocular movements intact, No Nystagmus. Eyeball - Right-Extraocular movements intact, No Nystagmus. Cornea - Left-No Hazy. Cornea - Right-No Hazy. Sclera/Conjunctiva - Left-No scleral icterus, No Discharge. Sclera/Conjunctiva - Right-No scleral icterus, No Discharge. Pupil - Left-Direct reaction to light normal. Pupil - Right-Direct reaction to light normal.  ENMT Ears Pinna - Left - no drainage observed, no generalized tenderness observed. Right - no drainage observed, no generalized tenderness observed. Nose and Sinuses External Inspection of the Nose - no destructive lesion observed. Inspection of the nares - Left - quiet respiration. Right - quiet respiration. Mouth and Throat Lips - Upper Lip - no fissures observed, no pallor noted. Lower Lip - no fissures observed, no  pallor noted. Nasopharynx - no discharge present. Oral Cavity/Oropharynx - Tongue - no dryness observed. Oral Mucosa - no cyanosis observed. Hypopharynx - no evidence of airway distress observed.  Chest and Lung Exam Inspection Movements - Normal and Symmetrical. Accessory muscles - No use of accessory muscles in breathing. Palpation Palpation of the chest reveals - Non-tender. Auscultation Breath sounds - Normal and Clear.  Cardiovascular Auscultation Rhythm - Regular. Murmurs & Other Heart Sounds - Auscultation of the heart reveals - No Murmurs and No Systolic Clicks.  Abdomen Inspection Inspection of the abdomen reveals - No Visible peristalsis and No Abnormal pulsations. Umbilicus - No Bleeding, No Urine drainage. Palpation/Percussion Palpation and Percussion of the abdomen reveal - Soft, Non Tender, No Rebound tenderness, No Rigidity (guarding) and No Cutaneous hyperesthesia. Note: Abdomen soft and flat.  No umbilical hernia. Obvious left upper quadrant spherical mobile mass about the size of a grapefruit. Not fluctuant. The patient and daughter feel it as well.   Male Genitourinary Sexual Maturity Tanner 5 - Adult hair pattern and Adult penile size and shape.  Peripheral Vascular Upper Extremity Inspection - Left - No Cyanotic nailbeds, Not Ischemic. Right - No Cyanotic nailbeds, Not Ischemic.  Neurologic Neurologic evaluation reveals -normal attention span and ability to concentrate, able to name objects and repeat phrases. Appropriate fund of knowledge , normal sensation and normal coordination. Mental Status Affect - not angry, not paranoid. Cranial Nerves-Normal Bilaterally. Gait-Normal.  Neuropsychiatric Mental status exam performed with findings of-able to articulate well with normal speech/language, rate, volume and coherence, thought content normal with ability to perform basic computations and apply abstract reasoning and no evidence of hallucinations,  delusions, obsessions or homicidal/suicidal ideation.  Musculoskeletal Global Assessment Spine, Ribs and Pelvis - no instability, subluxation or laxity. Right Upper Extremity - no instability, subluxation or laxity.  Lymphatic Head & Neck  General Head & Neck Lymphatics: Bilateral - Description - No Localized lymphadenopathy. Axillary  General Axillary Region: Bilateral - Description - No Localized lymphadenopathy. Femoral & Inguinal  Generalized Femoral & Inguinal Lymphatics: Left - Description - No Localized lymphadenopathy. Right - Description - No Localized lymphadenopathy.    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 It is mostly mobile and smooth so hopefully likelihood less. This will require surgical resection. Would plan hand-assisted given large size with low threshold to convert to open. Discussed with my surgical partners (Rosenbower, Gerkin)as well.  The anatomy and physiology of the adrenal gland was discussed. Pathophysiology of the adrenal problem was discussed. Options were discussed, and I made a recommendation to remove the adrenal gland & to treat the pathology. Minimally invasive & open techniques discussed.  Risks of bleeding, infection, injury to other organs, reoperation, death, and other risks were discussed. I noted a good likelihood this will help address the problem. While there are risks, I feel the risks of nonoperative management are greater; therefore, I feel surgery offers the best option. Educational material was available. We will work to minimize complications.  Obtain plasma and urinary studies to make sure is not a hormone producing tumor, especially a pheochromocytoma. He is only on 2 blood pressure medications which control his hypertension well so I doubt that.  He will require cardiac clearance. Sees Dr. Daneen Schick.  I strongly  encouraged him to quit smoking as well. Current Plans  Instructions:  STOP SMOKING!  We strongly recommend that you stop smoking. Smoking increases the risk of surgery including infection in the form of an open wound, pus formation, abscess, hernia at an incision on the abdomen, etc. You have an increased risk of other MAJOR complications such as stroke, heart attack, forming clots in the leg and/or lungs, and death.  Smoking Cessation Quitting smoking is important to your health and has many advantages. However, it is not always easy to quit since nicotine is a very addictive drug. Often times, people try 3 times or more before being able to quit. This document explains the best ways for you to prepare to quit smoking. Quitting takes hard work and a lot of effort, but you can do it. ADVANTAGES OF QUITTING SMOKING  You will live longer, feel better, and live better.  Your body will feel the impact of quitting  smoking almost immediately.  Within 20 minutes, blood pressure decreases. Your pulse returns to its normal level.  After 8 hours, carbon monoxide levels in the blood return to normal. Your oxygen level increases.  After 24 hours, the chance of having a heart attack starts to decrease. Your breath, hair, and body stop smelling like smoke.  After 48 hours, damaged nerve endings begin to recover. Your sense of taste and smell improve.  After 72 hours, the body is virtually free of nicotine. Your bronchial tubes relax and breathing becomes easier.  After 2 to 12 weeks, lungs can hold more air. Exercise becomes easier and circulation improves.  The risk of having a heart attack, stroke, cancer, or lung disease is greatly reduced.  After 1 year, the risk of coronary heart disease is cut in half.  After 5 years, the risk of stroke falls to the same as a nonsmoker.  After 10 years, the risk of lung cancer is cut in half and the risk of other cancers decreases significantly.  After  15 years, the risk of coronary heart disease drops, usually to the level of a nonsmoker.  If you are pregnant, quitting smoking will improve your chances of having a healthy baby.  The people you live with, especially any children, will be healthier.  You will have extra money to spend on things other than cigarettes. QUESTIONS TO THINK ABOUT BEFORE ATTEMPTING TO QUIT You may want to talk about your answers with your caregiver.  Why do you want to quit?  If you tried to quit in the past, what helped and what did not?  What will be the most difficult situations for you after you quit? How will you plan to handle them?  Who can help you through the tough times? Your family? Friends? A caregiver?  What pleasures do you get from smoking? What ways can you still get pleasure if you quit? Here are some questions to ask your caregiver:  How can you help me to be successful at quitting?  What medicine do you think would be best for me and how should I take it?  What should I do if I need more help?  What is smoking withdrawal like? How can I get information on withdrawal? GET READY  Set a quit date.  Change your environment by getting rid of all cigarettes, ashtrays, matches, and lighters in your home, car, or work. Do not let people smoke in your home.  Review your past attempts to quit. Think about what worked and what did not. GET SUPPORT AND ENCOURAGEMENT You have a better chance of being successful if you have help. You can get support in many ways.  Tell your family, friends, and co-workers that you are going to quit and need their support. Ask them not to smoke around you.  Get individual, group, or telephone counseling and support. Programs are available at [employer] and health centers. Call your local health department for information about programs in your area.  Spiritual beliefs and practices may help some smokers quit.  Download a "quit meter" on your computer  to keep track of quit statistics, such as how long you have gone without smoking, cigarettes not smoked, and money saved.  Get a self-help book about quitting smoking and staying off of tobacco. [place] yourself from urges to smoke. Talk to someone, go for a walk, or occupy your time with a task.  Change your normal routine. Take  a different route to work. Drink tea instead of coffee. Eat breakfast in a different place.  Reduce your stress. Take a hot bath, exercise, or read a book.  Plan something enjoyable to do every day. Reward yourself for not smoking.  Explore interactive web-based programs that specialize in helping you quit. GET MEDICINE AND USE IT CORRECTLY Medicines can help you stop smoking and decrease the urge to smoke. Combining medicine with the above behavioral methods and support can greatly increase your chances of successfully quitting smoking.  Nicotine replacement therapy helps deliver nicotine to your body without the negative effects and risks of smoking. Nicotine replacement therapy includes nicotine gum, lozenges, inhalers, nasal sprays, and skin patches. Some may be available over-the-counter and others require a prescription.  Antidepressant medicine helps people abstain from smoking, but how this works is unknown. This medicine is available by prescription.  Nicotinic receptor partial agonist medicine simulates the effect of nicotine in your brain. This medicine is available by prescription. Ask your caregiver for advice about which medicines to use and how to use them based on your health history. Your caregiver will tell you what side effects to look out for if you choose to be on a medicine or therapy. Carefully read the information on the package. Do not use any other product containing nicotine while using a nicotine replacement product. RELAPSE OR DIFFICULT SITUATIONS Most relapses occur within the first 3 months after  quitting. Do not be discouraged if you start smoking again. Remember, most people try several times before finally quitting. You may have symptoms of withdrawal because your body is used to nicotine. You may crave cigarettes, be irritable, feel very hungry, cough often, get headaches, or have difficulty concentrating. The withdrawal symptoms are only temporary. They are strongest when you first quit, but they will go away within 10 14 days. To reduce the chances of relapse, try to:  Avoid drinking alcohol. Drinking lowers your chances of successfully quitting.  Reduce the amount of caffeine you consume. Once you quit smoking, the amount of caffeine in your body increases and can give you symptoms, such as a rapid heartbeat, sweating, and anxiety.  Avoid smokers because they can make you want to smoke.  Do not let weight gain distract you. Many smokers will gain weight when they quit, usually less than 10 pounds. Eat a healthy diet and stay active. You can always lose the weight gained after you quit.  Find ways to improve your mood other than smoking. FOR MORE INFORMATION www.smokefree.gov   While it can be one of the most difficult things to do, the [place] community has programs to help you stop. Consider talking with your primary care physician about options. Also, Smoking Cessation classes are available through the [facility]:  The smoking cessation program is a proven-effective program from the American Lung Association. The program is available for anyone 22 and older who currently smokes. The program lasts for 7 weeks and is 8 sessions. Each class will be approximately 1 1/2 hours. The program is every Tuesday. All classes are 12-1:30pm and same location.  Event Location Information: Location: [facility] 2nd Floor Conference Room 2-037; located next to [facility] cross streets: [place] Entrance into the [facility] is adjacent to the [employer] main entrance. The conference room is located on the 2nd floor. Parking Instructions: Visitor parking is adjacent to [employer] main entrance and  the [place]   A smoking cessation program is also offered through the [facility] [place]. Register online at ClickDebate.gl or call [phone] for more information. Tobacco cessation counseling is available at [facility]. Call [phone] for a free appointment. Tobacco cessation classes also are available through the [place] in [place]. For information, call [phone]. The Patient Education Network features videos on tobacco cessation. Please consult your listings in the center of this book to find instructions on how to access this resource. If you want more information, ask your nurse.    Addendum     Jacob Olp, MD  Tue Jan 17, 2014 2:02:02 PM EDT     ADDENDUM REPORT: 01/17/2014 13:59  ADDENDUM: These results will be called to the ordering clinician or representative by the Radiologist Assistant, and communication documented in the PACS or zVision Dashboard.   Electronically Signed  By: Jacob Christian M.D.  On: 01/17/2014 13:59      Study Result     CLINICAL DATA: Physician felt abdominal mass on exam.  EXAM: CT ABDOMEN AND PELVIS WITH CONTRAST  TECHNIQUE: Multidetector CT imaging of the abdomen and pelvis was performed using the standard protocol following bolus administration of intravenous contrast.  CONTRAST: 176mL OMNIPAQUE IOHEXOL 300 MG/ML SOLN  COMPARISON: None.  FINDINGS: Lung bases are within normal. Several old right lower rib fractures are present. Increased density over the mitral and aortic valves may be calcification.  Abdominal images demonstrate a a well-defined round mass in the left mid to upper quadrant measuring 13.6 x 13.7 cm x 14.8 cm in its  AP, transverse and craniocaudal dimensions. There are areas of mural nodularity and patchy areas of calcification within this mass. This mass appears to be originating from the lateral limb of the left adrenal gland, although cannot exclude the possibility of a pancreatic tail mass. This mass displaces the left kidney slightly posteriorly. This mass appears to displace the stomach and pancreatic tail anterior medially. There are a few subcentimeter periaortic lymph nodes.  The liver, spleen, gallbladder and pancreas are otherwise unremarkable. The right adrenal gland demonstrates a 2.7 cm low-density mass with Hounsfield unit measurements of 14 likely an adenoma. Kidneys are normal in size as there is a 2 mm stone over the mid pole of the left kidney. Several vascular calcifications are present over the renal hilum bilaterally. There are a few small bilateral subcentimeter renal cortical hypodensities too small to characterize but likely cysts. There is mild stranding/ fluid in the left perinephric space. Ureters are unremarkable. There is diverticulosis throughout the colon. The appendix is normal. There is moderate calcified plaque over the abdominal aorta and iliac arteries.  Pelvic images demonstrate the bladder, prostate and rectum to be within normal. There are degenerative changes of the spine and hips. There is moderate disc disease at the L3-4 level. There are a few tiny sclerotic foci over the pelvic bones/proximal femurs likely bone islands.  IMPRESSION: Large well-defined round mass within the left mid to upper abdomen measuring 13.6 x 13.7 x 14.8 cm with mural nodularity and patchy areas of calcification. 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 Other adrenal processes such as metastatic disease, complicated hematoma or pheochromocytoma are unlikely. Other retroperitoneal  neoplasms or possible pancreatic tail neoplasm are less likely. Primary adrenal neoplasm may or may not be hormonally active as consider correlation with cortisol levels and electrolytes. PET-CT  may also be helpful prior to surgical consultation.  Few small bilateral renal cortical hypodensities likely cysts but too small to characterize. 2 mm left renal stone.  Diverticulosis of the colon.  Electronically Signed: By: Jacob Christian M.D. On: 01/17/2014 13:49     Instructions:  You have a mass growing in your left adrenal gland on top of your kidney. It needs to be removed. Risk of cancer.  Cardiologist has cleared you.  Continue to walk an hour a day to improve exercise tolerance.  STOP SMOKING! We talked to the patient about the dangers of smoking.  We stressed that tobacco use dramatically increases the risk of peri-operative complications such as infection, tissue necrosis leaving to problems with incision/wound and organ healing, hernia, chronic pain, heart attack, stroke, DVT, pulmonary embolism, and death.  We noted there are programs in our community to help stop smoking.  Information was available.   We are going to order blood and urine studies to better evaluate this mass.  Consider colonoscopy at some point to rule out any colon polyps or cancer  Jacob Christian, M.D., F.A.C.S. Gastrointestinal and Minimally Invasive Surgery [facility] [address], [place], [place] [address] [phone] Main / Paging

## 2014-05-13 DIAGNOSIS — J449 Chronic obstructive pulmonary disease, unspecified: Secondary | ICD-10-CM | POA: Diagnosis not present

## 2014-05-13 DIAGNOSIS — Z808 Family history of malignant neoplasm of other organs or systems: Secondary | ICD-10-CM | POA: Diagnosis not present

## 2014-05-13 DIAGNOSIS — E785 Hyperlipidemia, unspecified: Secondary | ICD-10-CM | POA: Diagnosis present

## 2014-05-13 DIAGNOSIS — I739 Peripheral vascular disease, unspecified: Secondary | ICD-10-CM | POA: Diagnosis present

## 2014-05-13 DIAGNOSIS — F17210 Nicotine dependence, cigarettes, uncomplicated: Secondary | ICD-10-CM | POA: Diagnosis present

## 2014-05-13 DIAGNOSIS — E279 Disorder of adrenal gland, unspecified: Secondary | ICD-10-CM | POA: Diagnosis not present

## 2014-05-13 DIAGNOSIS — R1900 Intra-abdominal and pelvic swelling, mass and lump, unspecified site: Secondary | ICD-10-CM | POA: Diagnosis present

## 2014-05-13 DIAGNOSIS — I10 Essential (primary) hypertension: Secondary | ICD-10-CM | POA: Diagnosis not present

## 2014-05-13 DIAGNOSIS — K219 Gastro-esophageal reflux disease without esophagitis: Secondary | ICD-10-CM | POA: Diagnosis present

## 2014-05-13 DIAGNOSIS — K567 Ileus, unspecified: Secondary | ICD-10-CM | POA: Diagnosis not present

## 2014-05-13 LAB — CBC
HCT: 33.1 % — ABNORMAL LOW (ref 39.0–52.0)
Hemoglobin: 11 g/dL — ABNORMAL LOW (ref 13.0–17.0)
MCH: 32.7 pg (ref 26.0–34.0)
MCHC: 33.2 g/dL (ref 30.0–36.0)
MCV: 98.5 fL (ref 78.0–100.0)
Platelets: 225 10*3/uL (ref 150–400)
RBC: 3.36 MIL/uL — ABNORMAL LOW (ref 4.22–5.81)
RDW: 13 % (ref 11.5–15.5)
WBC: 13.3 10*3/uL — ABNORMAL HIGH (ref 4.0–10.5)

## 2014-05-13 LAB — BASIC METABOLIC PANEL
Anion gap: 7 (ref 5–15)
BUN: 12 mg/dL (ref 6–23)
CO2: 26 mmol/L (ref 19–32)
Calcium: 8.9 mg/dL (ref 8.4–10.5)
Chloride: 107 mmol/L (ref 96–112)
Creatinine, Ser: 0.8 mg/dL (ref 0.50–1.35)
GFR calc Af Amer: >90 mL/min (ref >90)
GFR calc non Af Amer: 88 mL/min — ABNORMAL LOW (ref >90)
Glucose, Bld: 116 mg/dL — ABNORMAL HIGH (ref 70–99)
Potassium: 3.9 mmol/L (ref 3.5–5.1)
Sodium: 140 mmol/L (ref 135–145)

## 2014-05-13 LAB — LIPASE, BLOOD: Lipase: 27 U/L (ref 11–59)

## 2014-05-13 MED ORDER — SODIUM CHLORIDE 0.9 % IJ SOLN: 3.0000 mL | INTRAMUSCULAR | Status: DC | PRN

## 2014-05-13 MED ORDER — SODIUM CHLORIDE 0.9 % IJ SOLN: 3.0000 mL | Freq: Two times a day (BID) | INTRAMUSCULAR | Status: DC

## 2014-05-13 MED ORDER — LISINOPRIL 10 MG PO TABS
10.0000 mg | ORAL_TABLET | Freq: Every day | ORAL | Status: DC
  Administered 2014-05-13 – 2014-05-14 (×2): 10 mg via ORAL
  Filled 2014-05-13 (×3): qty 1

## 2014-05-13 MED ORDER — AMLODIPINE BESYLATE 5 MG PO TABS
5.0000 mg | ORAL_TABLET | Freq: Every day | ORAL | Status: DC
  Administered 2014-05-13 – 2014-05-15 (×3): 5 mg via ORAL
  Filled 2014-05-13 (×4): qty 1

## 2014-05-13 MED ORDER — LACTATED RINGERS IV BOLUS (SEPSIS): 1000.0000 mL | Freq: Three times a day (TID) | INTRAVENOUS | Status: AC | PRN

## 2014-05-13 MED ORDER — LISINOPRIL 40 MG PO TABS: 40.0000 mg | ORAL_TABLET | Freq: Every day | ORAL | Status: DC

## 2014-05-13 MED ORDER — SODIUM CHLORIDE 0.9 % IV SOLN: 250.0000 mL | INTRAVENOUS | Status: DC | PRN

## 2014-05-13 NOTE — Progress Notes (Signed)
[facility]  [address] [place], [place] [address] Phone: [phone] FAX: [phone]    BRANSEN FASSNACHT [id number] [birth year]  CARE TEAM:  PCP: Donnie Coffin, MD  Outpatient Care Team: Patient Care Team: Donnie Coffin, MD as PCP - General (Family Medicine)  Inpatient Treatment Team: Treatment Team: Attending Provider: Michael Boston, MD; Registered Nurse: Emeterio Reeve, RN   Subjective:  Sore Some hiccups but tol clears Walking a little  Objective:  Vital signs:  Filed Vitals:   05/12/14 1700 05/12/14 2113 05/13/14 0147 05/13/14 0611  BP: 160/87 154/82 163/67 167/71  Pulse: 86 77 73 72  Temp: 98.2 F (36.8 C) 99 F (37.2 C) 98.2 F (36.8 C) 98.7 F (37.1 C)  TempSrc: Oral Oral Oral Oral  Resp: 16 20 18 20   Height:      Weight:      SpO2: 100% 98% 100% 99%       Intake/Output   Yesterday:  01/29 0701 - 01/30 0700 In: 4101.3 [P.O.:240; I.V.:3861.3] Out: 7741 [Urine:4350; Drains:235; Blood:400] This shift:     Bowel function:  Flatus: n  BM: n  Drain: serossanguinous  Physical Exam:  General: Pt awake/alert/oriented x4 in no acute distress Eyes: PERRL, normal EOM.  Sclera clear.  No icterus Neuro: CN II-XII intact w/o focal sensory/motor deficits. Lymph: No head/neck/groin lymphadenopathy Psych:  No delerium/psychosis/paranoia HENT: Normocephalic, Mucus membranes moist.  No thrush Neck: Supple, No tracheal deviation Chest: No chest wall pain w good excursion CV:  Pulses intact.  Regular rhythm MS: Normal AROM mjr joints.  No obvious deformity Abdomen: Soft.  Nondistended.  Mildly tender at incisions only.  No evidence of peritonitis.  No incarcerated hernias. Ext:  SCDs BLE.  No mjr edema.  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 male  1 Day Post-Op  Procedure(s): POST-OPERATIVE DIAGNOSIS: Left Adrenal/Retroperitoneal mass  PROCEDURE:   LAPAROSCOPIC ASSISTED RESECTION OF RETROPERITONEAL MASS WITH LEFT ADRENALECTOMY  SURGEON: Surgeon(s): Michael Boston, MD Leighton Ruff, MD - Assist   Stable  Plan:  -liquids until flatus - go slowly -wean IVF to PRN -HTN - control better -VTE prophylaxis- SCDs, etc -mobilize as tolerated to help recovery  I updated the patient's status to the patient.  OR findings noted.  Recommendations were made.  Questions were answered.  The patient expressed understanding & appreciation.   Adin Hector, M.D., F.A.C.S. Gastrointestinal and Minimally Invasive Surgery [facility] [address], [place], [place] [address] [phone] Main / Paging   05/13/2014   Results:   Labs: Results for orders placed or performed during the hospital encounter of 05/12/14 (from the past 48 hour(s))  Basic metabolic panel     Status: Abnormal   Collection Time: 05/13/14  5:00 AM  Result Value Ref Range   Sodium 140 135 - 145 mmol/L   Potassium 3.9 3.5 - 5.1 mmol/L   Chloride 107 96 - 112 mmol/L   CO2 26 19 - 32 mmol/L   Glucose, Bld 116 (H) 70 - 99 mg/dL   BUN 12 6 - 23 mg/dL   Creatinine, Ser 0.80 0.50 - 1.35 mg/dL   Calcium 8.9 8.4 - 10.5 mg/dL   GFR calc non Af Amer 88 (L) >  90 mL/min   GFR calc Af Amer >90 >90 mL/min    Comment: (NOTE) The eGFR has been calculated using the CKD EPI equation. This calculation has not been validated in all clinical situations. eGFR's persistently <90 mL/min signify possible Chronic Kidney Disease.    Anion gap 7 5 - 15  CBC     Status: Abnormal   Collection Time: 05/13/14  5:00 AM  Result Value Ref Range   WBC 13.3 (H) 4.0 - 10.5 K/uL   RBC 3.36 (L) 4.22 - 5.81 MIL/uL   Hemoglobin 11.0 (L) 13.0 - 17.0 g/dL   HCT 33.1 (L)  39.0 - 52.0 %   MCV 98.5 78.0 - 100.0 fL   MCH 32.7 26.0 - 34.0 pg   MCHC 33.2 30.0 - 36.0 g/dL   RDW 13.0 11.5 - 15.5 %   Platelets 225 150 - 400 K/uL  Lipase, blood     Status: None   Collection Time: 05/13/14  5:00 AM  Result Value Ref Range   Lipase 27 11 - 59 U/L    Imaging / Studies: No results found.  Medications / Allergies: per chart  Antibiotics: Anti-infectives    Start     Dose/Rate Route Frequency Ordered Stop   05/12/14 1400  ceFAZolin (ANCEF) IVPB 2 g/50 mL premix     2 g100 mL/hr over 30 Minutes Intravenous 3 times per day 05/12/14 1306     05/12/14 0534  ceFAZolin (ANCEF) IVPB 2 g/50 mL premix     2 g100 mL/hr over 30 Minutes Intravenous On call to O.R. 05/12/14 0534 05/12/14 0813       Note: Portions of this report may have been transcribed using voice recognition software. Every effort was made to ensure accuracy; however, inadvertent computerized transcription errors may be present.   Any transcriptional errors that result from this process are unintentional.

## 2014-05-13 NOTE — Progress Notes (Signed)
UR completed 

## 2014-05-14 NOTE — Progress Notes (Signed)
Patient ID: Jacob Christian, male   DOB: December 28, 1940, 74 y.o.   MRN: [id number] 2 Days Post-Op  Subjective: Feels better than yesterday. Still having some bloating and reflux symptoms but improved. Moderate incisional pain. Has had some further flatus and a small bowel movement with suppository. No nausea or vomiting.  Objective: Vital signs in last 24 hours: Temp:  [98 F (36.7 C)-98.5 F (36.9 C)] 98 F (36.7 C) (01/31 0556) Pulse Rate:  [68-78] 68 (01/31 0556) Resp:  [18-20] 18 (01/31 0556) BP: (154-174)/(62-76) 155/62 mmHg (01/31 0556) SpO2:  [96 %-99 %] 99 % (01/31 0556) Last BM Date: 05/13/14  Intake/Output from previous day: 01/30 0701 - 01/31 0700 In: 1380 [P.O.:1080; IV Piggyback:300] Out: 1995 [Urine:1875; Drains:120] Intake/Output this shift: Total I/O In: -  Out: 500 [Urine:500]  General appearance: alert, cooperative and no distress GI: mild distention. Soft and nontender. Incision/Wound: clean and dry without evidence of infection  Lab Results:   Recent Labs  05/13/14 0500  WBC 13.3*  HGB 11.0*  HCT 33.1*  PLT 225   BMET  Recent Labs  05/13/14 0500  NA 140  K 3.9  CL 107  CO2 26  GLUCOSE 116*  BUN 12  CREATININE 0.80  CALCIUM 8.9     Studies/Results: No results found.  Anti-infectives: Anti-infectives    Start     Dose/Rate Route Frequency Ordered Stop   05/12/14 1400  ceFAZolin (ANCEF) IVPB 2 g/50 mL premix     2 g100 mL/hr over 30 Minutes Intravenous 3 times per day 05/12/14 1306     05/12/14 0534  ceFAZolin (ANCEF) IVPB 2 g/50 mL premix     2 g100 mL/hr over 30 Minutes Intravenous On call to O.R. 05/12/14 0534 05/12/14 0813      Assessment/Plan: s/p Procedure(s): LAPAROSCOPIC LEFT ADRENALECTOMY Stable/improving postoperatively. Still appears to have some degree of ileus. Continue full liquids as tolerated. Ambulation encouraged. Check CBC in a.m.   LOS: 2 days    Noelle Sease T 05/14/2014

## 2014-05-15 ENCOUNTER — Encounter (HOSPITAL_COMMUNITY): Payer: Self-pay | Admitting: Surgery

## 2014-05-15 LAB — CBC
HCT: 32.6 % — ABNORMAL LOW (ref 39.0–52.0)
Hemoglobin: 10.9 g/dL — ABNORMAL LOW (ref 13.0–17.0)
MCH: 32.9 pg (ref 26.0–34.0)
MCHC: 33.4 g/dL (ref 30.0–36.0)
MCV: 98.5 fL (ref 78.0–100.0)
Platelets: 255 10*3/uL (ref 150–400)
RBC: 3.31 MIL/uL — ABNORMAL LOW (ref 4.22–5.81)
RDW: 12.7 % (ref 11.5–15.5)
WBC: 11.6 10*3/uL — ABNORMAL HIGH (ref 4.0–10.5)

## 2014-05-15 MED ORDER — PSYLLIUM 95 % PO PACK: 1.0000 | PACK | Freq: Two times a day (BID) | ORAL | Status: DC

## 2014-05-15 MED ORDER — SODIUM CHLORIDE 0.9 % IV SOLN: 250.0000 mL | INTRAVENOUS | Status: DC | PRN

## 2014-05-15 MED ORDER — OXYCODONE HCL 5 MG PO TABS
5.0000 mg | ORAL_TABLET | ORAL | Status: DC | PRN
  Administered 2014-05-15 – 2014-05-16 (×5): 10 mg via ORAL
  Filled 2014-05-15 (×5): qty 2

## 2014-05-15 MED ORDER — PANTOPRAZOLE SODIUM 40 MG PO TBEC
40.0000 mg | DELAYED_RELEASE_TABLET | Freq: Two times a day (BID) | ORAL | Status: DC
  Administered 2014-05-15 (×2): 40 mg via ORAL
  Filled 2014-05-15 (×2): qty 1

## 2014-05-15 MED ORDER — SODIUM CHLORIDE 0.9 % IJ SOLN: 3.0000 mL | INTRAMUSCULAR | Status: DC | PRN

## 2014-05-15 MED ORDER — POLYETHYLENE GLYCOL 3350 17 G PO PACK
17.0000 g | PACK | Freq: Two times a day (BID) | ORAL | Status: DC
  Administered 2014-05-15 (×2): 17 g via ORAL
  Filled 2014-05-15 (×4): qty 1

## 2014-05-15 MED ORDER — LISINOPRIL 40 MG PO TABS
40.0000 mg | ORAL_TABLET | Freq: Every day | ORAL | Status: DC
  Administered 2014-05-15: 40 mg via ORAL
  Filled 2014-05-15 (×3): qty 1

## 2014-05-15 MED ORDER — LACTATED RINGERS IV BOLUS (SEPSIS): 1000.0000 mL | Freq: Three times a day (TID) | INTRAVENOUS | Status: DC | PRN

## 2014-05-15 MED ORDER — SODIUM CHLORIDE 0.9 % IJ SOLN: 3.0000 mL | Freq: Two times a day (BID) | INTRAMUSCULAR | Status: DC

## 2014-05-16 NOTE — Discharge Summary (Signed)
Physician Discharge Summary  Patient ID: Jacob Christian MRN: [id number] DOB/AGE: [birth year] 72 y.o.  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  Postoperatively, the patient gradually mobilized and advanced to a solid diet.  Pain and other symptoms were treated aggressively.  On chronic hydrocodone at home.  Moderate HTN controlled with gradually returning BP meds.  By the time of discharge, the patient was walking well the hallways, eating food, having flatus.  Pain was well-controlled on an oral medications.  Based on meeting discharge criteria and continuing to recover, I felt it was safe for the patient to be discharged from the hospital to further recover with close followup. Postoperative recommendations were discussed in detail.  They are written as well.   Significant Diagnostic Studies:  Results for orders placed or performed during the hospital encounter of 05/12/14 (from the past 72 hour(s))  CBC     Status: Abnormal   Collection Time: 05/15/14  5:30 AM  Result Value Ref Range   WBC 11.6 (H) 4.0 - 10.5 K/uL   RBC 3.31 (L) 4.22 - 5.81 MIL/uL   Hemoglobin 10.9 (L) 13.0 - 17.0 g/dL   HCT 32.6 (L) 39.0 - 52.0 %   MCV  98.5 78.0 - 100.0 fL   MCH 32.9 26.0 - 34.0 pg   MCHC 33.4 30.0 - 36.0 g/dL   RDW 12.7 11.5 - 15.5 %   Platelets 255 150 - 400 K/uL    No results found.  Discharge Exam: Blood pressure 137/66, pulse 75, temperature 97.8 F (36.6 C), temperature source Oral, resp. rate 16, height 5\' 11"  (1.803 m), weight 188 lb (85.276 kg), SpO2 100 %.  General: Pt awake/alert/oriented x4 in no major acute distress Eyes: PERRL, normal EOM. Sclera nonicteric Neuro: CN II-XII intact w/o focal sensory/motor deficits. Lymph: No head/neck/groin lymphadenopathy Psych:  No delerium/psychosis/paranoia HENT: Normocephalic, Mucus membranes moist.  No thrush Neck: Supple, No tracheal deviation Chest: No pain.  Good respiratory excursion. CV:  Pulses intact.  Regular rhythm MS: Normal AROM mjr joints.  No obvious deformity Abdomen: Soft, Much less distended.  Mildly sore at upper midline incision.  Incision clean - no cellulitis.  Old LLQ drain site clean.  No incarcerated hernias. Ext:  SCDs BLE.  No significant edema.  No cyanosis Skin: No petechiae / purpura  Discharged Condition: good   Past Medical History  Diagnosis Date  . Hypertension   . Arthritis   . GERD (gastroesophageal reflux disease)   . Anxiety   . Peripheral vascular disease   . Heart murmur     "leaky valve" being monitored  . COPD (chronic obstructive pulmonary disease)     "borderline"  . Sciatica     "occasional"-right side  . 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 Leg fracture repair Right 1996    metal plate in place, with bone graft  . Laparoscopic adrenalectomy Left 05/12/2014    Procedure: LAPAROSCOPIC LEFT ADRENALECTOMY;  Surgeon: Michael Boston, MD;  Location: [facility];  Service: General;  Laterality: Left;    History   Social History  . Marital Status: Widowed    Spouse Name: N/A    Number of Children: N/A   . Years of Education: N/A   Occupational History  . Not on file.   Social History Main Topics  . Smoking status: Current Every Day Smoker -- 0.50 packs/day for 50 years    Types: Cigarettes  . Smokeless tobacco: Never Used  . Alcohol Use: Yes     Comment: occasional  . Drug Use: No     Comment: quit marijuana in 2014  . Sexual Activity: Not on file   Other Topics Concern  . Not on file   Social History Narrative    Family History  Problem Relation Age of Onset  . Throat cancer Mother   . CVA Father   . Hypertension Father   . CVA Brother   . Pancreatic cancer Maternal Uncle   . Throat cancer Maternal Grandmother   . Colon cancer Brother     Current Facility-Administered Medications  Medication Dose Route Frequency Provider Last Rate Last Dose  . 0.9 %  sodium chloride infusion  250 mL Intravenous PRN Michael Boston, MD      . 0.9 %  sodium chloride infusion  250 mL Intravenous PRN Michael Boston, MD      . acetaminophen (TYLENOL) tablet 1,000 mg  1,000 mg Oral TID Michael Boston, MD   1,000 mg at 05/15/14 2123  . ALPRAZolam Duanne Moron) tablet 0.5 mg  0.5 mg Oral TID PRN Michael Boston, MD   0.5 mg at 05/15/14 1606  . alum & mag hydroxide-simeth (MAALOX/MYLANTA) 200-200-20 MG/5ML suspension 30 mL  30 mL Oral Q6H PRN Michael Boston, MD   30 mL at 05/14/14 1633  . amLODipine (NORVASC) tablet 5 mg  5 mg Oral Daily Michael Boston, MD   5 mg at 05/15/14 1040  . bisacodyl (DULCOLAX) suppository 10 mg  10 mg Rectal Q12H PRN Michael Boston, MD   10 mg at 05/14/14 0946  . ceFAZolin (ANCEF) IVPB 2 g/50 mL premix  2 g Intravenous 3 times per day Michael Boston, MD   2 g at 05/16/14 0531  . diphenhydrAMINE (BENADRYL) capsule 25 mg  25 mg Oral Q6H PRN Michael Boston, MD      . diphenhydrAMINE (BENADRYL) injection 12.5-25 mg  12.5-25 mg Intravenous Q6H PRN Michael Boston, MD      . etodolac (LODINE) capsule 400 mg  400 mg Oral BID PRN Michael Boston, MD      . heparin injection 5,000 Units  5,000 Units  Subcutaneous 3 times per day Michael Boston, MD   5,000 Units at 05/16/14 0546  . HYDROmorphone (DILAUDID) injection 0.5-2 mg  0.5-2 mg Intravenous Q30 min PRN Michael Boston, MD   2 mg at 05/15/14 0553  . lactated ringers bolus 1,000 mL  1,000 mL Intravenous Q8H PRN Michael Boston, MD      . lip balm (CARMEX) ointment 1 application  1 application Topical BID Michael Boston, MD   1 application at 02/06/84 2200  . lisinopril (PRINIVIL,ZESTRIL) tablet 40 mg  40 mg Oral Daily Michael Boston, MD   40 mg at 05/15/14 1153  .  magic mouthwash  15 mL Oral QID PRN Michael Boston, MD      . menthol-cetylpyridinium (CEPACOL) lozenge 3 mg  1 lozenge Oral PRN Michael Boston, MD      . metoprolol (LOPRESSOR) injection 5 mg  5 mg Intravenous Q6H PRN Michael Boston, MD      . metoprolol tartrate (LOPRESSOR) tablet 12.5 mg  12.5 mg Oral Q12H PRN Michael Boston, MD      . multivitamin with minerals tablet 1 tablet  1 tablet Oral Daily Michael Boston, MD   1 tablet at 05/15/14 1154  . ondansetron (ZOFRAN) injection 4 mg  4 mg Intravenous Q6H PRN Michael Boston, MD       Or  . ondansetron (ZOFRAN) 8 mg/NS 50 ml IVPB  8 mg Intravenous Q6H PRN Michael Boston, MD      . oxyCODONE (Oxy IR/ROXICODONE) immediate release tablet 5-10 mg  5-10 mg Oral Q4H PRN Michael Boston, MD   10 mg at 05/16/14 0531  . pantoprazole (PROTONIX) EC tablet 40 mg  40 mg Oral BID Michael Boston, MD   40 mg at 05/15/14 2123  . phenol (CHLORASEPTIC) mouth spray 2 spray  2 spray Mouth/Throat PRN Michael Boston, MD      . polyethylene glycol (MIRALAX / GLYCOLAX) packet 17 g  17 g Oral Q12H PRN Michael Boston, MD      . polyethylene glycol (MIRALAX / GLYCOLAX) packet 17 g  17 g Oral BID Michael Boston, MD   17 g at 05/15/14 2124  . polyvinyl alcohol (LIQUIFILM TEARS) 1.4 % ophthalmic solution 1 drop  1 drop Both Eyes TID PRN Michael Boston, MD      . promethazine (PHENERGAN) injection 6.25-12.5 mg  6.25-12.5 mg Intravenous Q4H PRN Michael Boston, MD      . saccharomyces boulardii  (FLORASTOR) capsule 250 mg  250 mg Oral BID Michael Boston, MD   250 mg at 05/15/14 2123  . sodium chloride 0.9 % injection 3 mL  3 mL Intravenous Q12H Michael Boston, MD   3 mL at 05/13/14 1000  . sodium chloride 0.9 % injection 3 mL  3 mL Intravenous PRN Michael Boston, MD      . sodium chloride 0.9 % injection 3 mL  3 mL Intravenous Q12H Michael Boston, MD   3 mL at 05/15/14 1036  . sodium chloride 0.9 % injection 3 mL  3 mL Intravenous PRN Michael Boston, MD         No Known Allergies  Disposition: 01-Home or Self Care  Discharge Instructions    Call MD for:  extreme fatigue    Complete by:  As directed      Call MD for:  hives    Complete by:  As directed      Call MD for:  persistant nausea and vomiting    Complete by:  As directed      Call MD for:  redness, tenderness, or signs of infection (pain, swelling, redness, odor or green/yellow discharge around incision site)    Complete by:  As directed      Call MD for:  severe uncontrolled pain    Complete by:  As directed      Call MD for:    Complete by:  As directed   Temperature > 101.6F     Diet - low sodium heart healthy    Complete by:  As directed      Discharge instructions    Complete by:  As directed  Please see discharge instruction sheets.  Also refer to handout given an office.  Please call our office if you have any questions or concerns [phone]     Discharge wound care:    Complete by:  As directed   If you have closed incisions, shower and bathe over these incisions with soap and water every day.  Remove all surgical dressings on postoperative day #3.  You do not need to replace dressings over the closed incisions unless you feel more comfortable with a Band-Aid covering it.   If you have an open wound that requires packing, please see wound care instructions.  In general, remove all dressings, wash wound with soap and water and then replace with saline moistened gauze.  Do the dressing change at least every day.   Please call our office [phone] if you have further questions.     Driving Restrictions    Complete by:  As directed   No driving until off narcotics and can safely swerve away without pain during an emergency     Increase activity slowly    Complete by:  As directed   Walk an hour a day.  Use 20-30 minute walks.  When you can walk 30 minutes without difficulty, increase to low impact/moderate activities such as biking, jogging, swimming, sexual activity..  Eventually can increase to unrestricted activity when not feeling pain.  If you feel pain: STOP!Marland Kitchen   Let pain protect you from overdoing it.  Use ice/heat/over-the-counter pain medications to help minimize his soreness.  Use pain prescriptions as needed to remain active.  It is better to take extra pain medications and be more active than to stay bedridden to avoid all pain medications.     Lifting restrictions    Complete by:  As directed   Avoid heavy lifting initially.  Do not push through pain.  You have no specific weight limit.  Coughing and sneezing or four more stressful to your incision than any lifting you will do. Pain will protect you from injury.  Therefore, avoid intense activity until off all narcotic pain medications.  Coughing and sneezing or four more stressful to your incision than any lifting he will do.     May shower / Bathe    Complete by:  As directed      May walk up steps    Complete by:  As directed      Sexual Activity Restrictions    Complete by:  As directed   Sexual activity as tolerated.  Do not push through pain.  Pain will protect you from injury.     Walk with assistance    Complete by:  As directed   Walk over an hour a day.  May use a walker/cane/companion to help with balance and stamina.            Medication List    TAKE these medications        acetaminophen 500 MG tablet  Commonly known as:  TYLENOL  Take 500 mg by mouth every 6 (six) hours as needed (Pain).     ALPRAZolam 0.5 MG  tablet  Commonly known as:  XANAX  Take 0.5 mg by mouth 3 (three) times daily as needed for anxiety.     amLODipine 5 MG tablet  Commonly known as:  NORVASC  Take 5 mg by mouth daily.     etodolac 400 MG tablet  Commonly known as:  LODINE  Take 400 mg by mouth 2 (  two) times daily as needed for mild pain.     Flax Seed Oil 1000 MG Caps  Take 1 capsule by mouth 2 (two) times a week.     HYDROcodone-acetaminophen 5-325 MG per tablet  Commonly known as:  NORCO/VICODIN  Take 0.5-1 tablets by mouth 2 (two) times daily as needed (Pain). Take as needed for pain     lisinopril 40 MG tablet  Commonly known as:  PRINIVIL,ZESTRIL  Take 40 mg by mouth daily.     multivitamin with minerals Tabs tablet  Take 1 tablet by mouth daily.     pantoprazole 40 MG tablet  Commonly known as:  PROTONIX  Take 40 mg by mouth daily.     polyvinyl alcohol 1.4 % ophthalmic solution  Commonly known as:  LIQUIFILM TEARS  Place 1 drop into both eyes 3 (three) times daily as needed for dry eyes.     Potassium Gluconate 595 MG Caps  Take 1 capsule by mouth every other day.           Follow-up Information    Follow up with Arlinda Barcelona C., MD In 3 weeks.   Specialty:  General Surgery   Why:  To follow up after your hospital stay, To follow up after your operation   Contact information:   [place] [address] [phone]        Signed: Morton Peters, M.D., F.A.C.S. Gastrointestinal and Minimally Invasive Surgery [facility] [address], [place], [place] [address] [phone] Main / Paging   05/16/2014, 7:37 AM

## 2014-05-16 NOTE — Progress Notes (Signed)
Patient is alert and oriented, vital signs are stable, incisions are within normal limits, patient is tolerating his diet without complaints of nausea or vomiting, pt to follow up with MD Earlyne Iba RN 9:05 AM  05-16-2014

## 2014-05-16 NOTE — Progress Notes (Signed)
[facility]  [address] [place], [place] [address] Phone: [phone] FAX: [phone]    RADIN RAPTIS [id number] 12-23-1940  CARE TEAM:  PCP: Donnie Coffin, MD  Outpatient Care Team: Patient Care Team: Donnie Coffin, MD as PCP - General (Family Medicine)  Inpatient Treatment Team: Treatment Team: Attending Provider: Michael Boston, MD; Registered Nurse: Emeterio Reeve, RN; Technician: Rudi Heap, NT; Registered Nurse: Mortimer Fries, RN   Subjective:  Less sore Walking a little better Small BM  Objective:  Vital signs:  Filed Vitals:   05/15/14 1100 05/15/14 1406 05/15/14 2054 05/16/14 0551  BP: 151/81 135/56 138/58 137/66  Pulse: 70 84 69 75  Temp: 98.3 F (36.8 C) 98.4 F (36.9 C) 98.5 F (36.9 C) 97.8 F (36.6 C)  TempSrc: Oral Oral Oral Oral  Resp:  18 18 16   Height:      Weight:      SpO2:  100% 98% 100%    Last BM Date: 05/15/14  Intake/Output   Yesterday:  02/01 0701 - 02/02 0700 In: 960 [P.O.:960] Out: -  This shift:     Bowel function:  Flatus: yes  BM: yes  Drain: serossanguinous  Physical Exam:  General: Pt awake/alert/oriented x4 in no acute distress Eyes: PERRL, normal EOM.  Sclera clear.  No icterus Neuro: CN II-XII intact w/o focal sensory/motor deficits. Lymph: No head/neck/groin lymphadenopathy Psych:  No delerium/psychosis/paranoia HENT: Normocephalic, Mucus membranes moist.  No thrush Neck: Supple, No tracheal deviation Chest: No chest wall pain w good excursion CV:  Pulses intact.  Regular rhythm MS: Normal AROM mjr joints.  No obvious deformity Abdomen: Soft.  Nondistended.  Mildly tender at incisions only.  No evidence of peritonitis.  No incarcerated hernias. Ext:  SCDs BLE.  No mjr edema.  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 male  4 Days Post-Op  Procedure(s): POST-OPERATIVE DIAGNOSIS: Left Adrenal/Retroperitoneal mass  PROCEDURE:   LAPAROSCOPIC ASSISTED RESECTION OF RETROPERITONEAL MASS WITH LEFT ADRENALECTOMY  SURGEON: Surgeon(s): Michael Boston, MD Leighton Ruff, MD - Assist   Stable  Plan:  -adv diet slowly -wean IVF to PRN -bowel regimen when better -f/u pathology -HTN - control better -VTE prophylaxis- SCDs, etc -mobilize as tolerated to help recovery  I updated the patient's status to the patient.  OR findings noted.  Recommendations were made.  Questions were answered.  The patient expressed understanding & appreciation.   Adin Hector, M.D., F.A.C.S. Gastrointestinal and Minimally Invasive Surgery [facility] [address], [place], [place] [address] [phone] Main / Paging   05/16/2014   Results:   Labs: Results for orders placed or performed during the hospital encounter of 05/12/14 (from the past 48 hour(s))  CBC     Status: Abnormal   Collection Time: 05/15/14  5:30 AM  Result Value Ref Range   WBC 11.6 (H) 4.0 - 10.5 K/uL   RBC 3.31 (L) 4.22 - 5.81 MIL/uL   Hemoglobin 10.9 (L) 13.0 - 17.0 g/dL   HCT 32.6 (L) 39.0 - 52.0 %   MCV 98.5 78.0 - 100.0 fL   MCH 32.9 26.0 - 34.0 pg   MCHC 33.4 30.0 - 36.0 g/dL  RDW 12.7 11.5 - 15.5 %   Platelets 255 150 - 400 K/uL    Imaging / Studies: No results found.  Medications / Allergies: per chart  Antibiotics: Anti-infectives    Start     Dose/Rate Route Frequency Ordered Stop   05/12/14 1400  ceFAZolin (ANCEF) IVPB 2 g/50 mL premix     2 g100 mL/hr over 30 Minutes Intravenous 3 times per day 05/12/14 1306     05/12/14 0534  ceFAZolin (ANCEF) IVPB 2 g/50 mL premix     2 g100 mL/hr over 30 Minutes Intravenous On call to O.R. 05/12/14  0534 05/12/14 0813       Note: Portions of this report may have been transcribed using voice recognition software. Every effort was made to ensure accuracy; however, inadvertent computerized transcription errors may be present.   Any transcriptional errors that result from this process are unintentional.

## 2014-05-16 NOTE — Discharge Instructions (Signed)
ABDOMINAL SURGERY: POST OP INSTRUCTIONS  1. DIET: Follow a light bland diet the first 24 hours after arrival home, such as soup, liquids, crackers, etc.  Be sure to include lots of fluids daily.  Avoid fast food or heavy meals as your are more likely to get nauseated.  Eat a low fat the next few days after surgery.   2. Take your usually prescribed home medications unless otherwise directed. 3. PAIN CONTROL: a. Pain is best controlled by a usual combination of three different methods TOGETHER: i. Ice/Heat ii. Over the counter pain medication iii. Prescription pain medication b. Most patients will experience some swelling and bruising around the incisions.  Ice packs or heating pads (30-60 minutes up to 6 times a day) will help. Use ice for the first few days to help decrease swelling and bruising, then switch to heat to help relax tight/sore spots and speed recovery.  Some people prefer to use ice alone, heat alone, alternating between ice & heat.  Experiment to what works for you.  Swelling and bruising can take several weeks to resolve.   c. It is helpful to take an over-the-counter pain medication regularly for the first few weeks.  Choose one of the following that works best for you: i. Naproxen (Aleve, etc)  Two 228m tabs twice a day ii. Ibuprofen (Advil, etc) Three 2071mtabs four times a day (every meal & bedtime) iii. Acetaminophen (Tylenol, etc) 500-65061mour times a day (every meal & bedtime) d. A  prescription for pain medication (such as oxycodone, hydrocodone, etc) should be given to you upon discharge.  Take your pain medication as prescribed.  i. If you are having problems/concerns with the prescription medicine (does not control pain, nausea, vomiting, rash, itching, etc), please call us [phone] see if we need to switch you to a different pain medicine that will work better for you and/or control your side effect better. ii. If you need a refill on your pain medication,  please contact your pharmacy.  They will contact our office to request authorization. Prescriptions will not be filled after 5 pm or on week-ends. 4. Avoid getting constipated.  Between the surgery and the pain medications, it is common to experience some constipation.  Increasing fluid intake and taking a fiber supplement (such as Metamucil, Citrucel, FiberCon, MiraLax, etc) 1-2 times a day regularly will usually help prevent this problem from occurring.  A mild laxative (prune juice, Milk of Magnesia, MiraLax, etc) should be taken according to package directions if there are no bowel movements after 48 hours.   5. Watch out for diarrhea.  If you have many loose bowel movements, simplify your diet to bland foods & liquids for a few days.  Stop any stool softeners and decrease your fiber supplement.  Switching to mild anti-diarrheal medications (Kayopectate, Pepto Bismol) can help.  If this worsens or does not improve, please call us.[place]. Wash / shower every day.  You may shower over the incision / wound.  Avoid baths until the skin is fully healed.  Continue to shower over incision(s) after the dressing is off. 7. Remove your waterproof bandages 5 days after surgery.  You may leave the incision open to air.  Remove any wicks or ribbons in your wound.  If you have an open wound, please see wound care instructions. You may replace a dressing/Band-Aid to cover the incision for comfort if you wish. 8. ACTIVITIES as tolerated:   a. You may resume regular (light)  daily activities beginning the next day--such as daily self-care, walking, climbing stairs--gradually increasing activities as tolerated.  If you can walk 30 minutes without difficulty, it is safe to try more intense activity such as jogging, treadmill, bicycling, low-impact aerobics, swimming, etc. b. Save the most intensive and strenuous activity for last such as sit-ups, heavy lifting, contact sports, etc  Refrain from any heavy lifting or straining  until you are off narcotics for pain control.   c. DO NOT PUSH THROUGH PAIN.  Let pain be your guide: If it hurts to do something, don't do it.  Pain is your body warning you to avoid that activity for another week until the pain goes down. d. You may drive when you are no longer taking prescription pain medication, you can comfortably wear a seatbelt, and you can safely maneuver your car and apply brakes. e. Dennis Bast may have sexual intercourse when it is comfortable.  9. FOLLOW UP in our office a. Please call [facility] at [phone] to set up an appointment to see your surgeon in the office for a follow-up appointment approximately 1-2 weeks after your surgery. b. Make sure that you call for this appointment the day you arrive home to insure a convenient appointment time. 10. IF YOU HAVE DISABILITY OR FAMILY LEAVE FORMS, BRING THEM TO THE OFFICE FOR PROCESSING.  DO NOT GIVE THEM TO YOUR DOCTOR.   WHEN TO CALL us [phone]: 1. Poor pain control 2. Reactions / problems with new medications (rash/itching, nausea, etc)  3. Fever over 101.5 F (38.5 C) 4. Inability to urinate 5. Nausea and/or vomiting 6. Worsening swelling or bruising 7. Continued bleeding from incision. 8. Increased pain, redness, or drainage from the incision  The clinic staff is available to answer your questions during regular business hours (8:30am-5pm).  Please dont hesitate to call and ask to speak to one of our nurses for clinical concerns.   A surgeon from [facility] is always on call at the hospitals   If you have a medical emergency, go to the nearest emergency room or call 911.    [facility], [place], [place], [place], [place]  [address] ? MAIN: [phone] ? TOLL FREE: [phone] ? FAX [phone] [email or web address]  GETTING TO GOOD BOWEL HEALTH. Irregular bowel habits such as constipation and diarrhea can lead to many problems over time.   Having one soft bowel movement a day is the most important way to prevent further problems.  The anorectal canal is designed to handle stretching and feces to safely manage our ability to get rid of solid waste (feces, poop, stool) out of our body.  BUT, hard constipated stools can act like ripping concrete bricks and diarrhea can be a burning fire to this very sensitive area of our body, causing inflamed hemorrhoids, anal fissures, increasing risk is perirectal abscesses, abdominal pain/bloating, an making irritable bowel worse.     The goal: ONE SOFT BOWEL MOVEMENT A DAY!  To have soft, regular bowel movements:   Drink at least 8 tall glasses of water a day.    Take plenty of fiber.  Fiber is the undigested part of plant food that passes into the colon, acting s natures broom to encourage bowel motility and movement.  Fiber can absorb and hold large amounts of water. This results in a larger, bulkier stool, which is soft and easier to pass. Work gradually over several weeks up to 6 servings a day of fiber (  25g a day even more if needed) in the form of: o Vegetables -- Root (potatoes, carrots, turnips), leafy green (lettuce, salad greens, celery, spinach), or cooked high residue (cabbage, broccoli, etc) o Fruit -- Fresh (unpeeled skin & pulp), Dried (prunes, apricots, cherries, etc ),  or stewed ( applesauce)  o Whole grain breads, pasta, etc (whole wheat)  o Bran cereals   Bulking Agents -- This type of water-retaining fiber generally is easily obtained each day by one of the following:  o Psyllium bran -- The psyllium plant is remarkable because its ground seeds can retain so much water. This product is available as Metamucil, Konsyl, Effersyllium, Per Diem Fiber, or the less expensive generic preparation in drug and health food stores. Although labeled a laxative, it really is not a laxative.  o Methylcellulose -- This is another fiber derived from wood which also retains water. It is available as  Citrucel. o Polyethylene Glycol - and artificial fiber commonly called Miralax or Glycolax.  It is helpful for people with gassy or bloated feelings with regular fiber o Flax Seed - a less gassy fiber than psyllium  No reading or other relaxing activity while on the toilet. If bowel movements take longer than 5 minutes, you are too constipated  AVOID CONSTIPATION.  High fiber and water intake usually takes care of this.  Sometimes a laxative is needed to stimulate more frequent bowel movements, but   Laxatives are not a good long-term solution as it can wear the colon out. o Osmotics (Milk of Magnesia, Fleets phosphosoda, Magnesium citrate, MiraLax, GoLytely) are safer than  o Stimulants (Senokot, Castor Oil, Dulcolax, Ex Lax)    o Do not take laxatives for more than 7days in a row.   IF SEVERELY CONSTIPATED, try a Bowel Retraining Program: o Do not use laxatives.  o Eat a diet high in roughage, such as bran cereals and leafy vegetables.  o Drink six (6) ounces of prune or apricot juice each morning.  o Eat two (2) large servings of stewed fruit each day.  o Take one (1) heaping tablespoon of a psyllium-based bulking agent twice a day. Use sugar-free sweetener when possible to avoid excessive calories.  o Eat a normal breakfast.  o Set aside 15 minutes after breakfast to sit on the toilet, but do not strain to have a bowel movement.  o If you do not have a bowel movement by the third day, use an enema and repeat the above steps.   Controlling diarrhea o Switch to liquids and simpler foods for a few days to avoid stressing your intestines further. o Avoid dairy products (especially milk & ice cream) for a short time.  The intestines often can lose the ability to digest lactose when stressed. o Avoid foods that cause gassiness or bloating.  Typical foods include beans and other legumes, cabbage, broccoli, and dairy foods.  Every person has some sensitivity to other foods, so listen to our  body and avoid those foods that trigger problems for you. o Adding fiber (Citrucel, Metamucil, psyllium, Miralax) gradually can help thicken stools by absorbing excess fluid and retrain the intestines to act more normally.  Slowly increase the dose over a few weeks.  Too much fiber too soon can backfire and cause cramping & bloating. o Probiotics (such as active yogurt, Align, etc) may help repopulate the intestines and colon with normal bacteria and calm down a sensitive digestive tract.  Most studies show it to be of mild help,  though, and such products can be costly. o Medicines: - Bismuth subsalicylate (ex. Kayopectate, Pepto Bismol) every 30 minutes for up to 6 doses can help control diarrhea.  Avoid if pregnant. - Loperamide (Immodium) can slow down diarrhea.  Start with two tablets (4mg  total) first and then try one tablet every 6 hours.  Avoid if you are having fevers or severe pain.  If you are not better or start feeling worse, stop all medicines and call your doctor for advice o Call your doctor if you are getting worse or not better.  Sometimes further testing (cultures, endoscopy, X-ray studies, bloodwork, etc) may be needed to help diagnose and treat the cause of the diarrhea.  Managing Pain  Pain after surgery or related to activity is often due to strain/injury to muscle, tendon, nerves and/or incisions.  This pain is usually short-term and will improve in a few months.   Many people find it helpful to do the following things TOGETHER to help speed the process of healing and to get back to regular activity more quickly:  1. Avoid heavy physical activity a.  no lifting greater than 20 pounds b. Do not push through the pain.  Listen to your body and avoid positions and maneuvers than reproduce the pain c. Walking is okay as tolerated, but go slowly and stop when getting sore.  d. Remember: If it hurts to do it, then dont do it! 2. Take Anti-inflammatory medication  a. Take with  food/snack around the clock for 1-2 weeks i. This helps the muscle and nerve tissues become less irritable and calm down faster b. Choose ONE of the following over-the-counter medications: i. Naproxen 220mg  tabs (ex. Aleve) 1-2 pills twice a day  ii. Ibuprofen 200mg  tabs (ex. Advil, Motrin) 3-4 pills with every meal and just before bedtime iii. Acetaminophen 500mg  tabs (Tylenol) 1-2 pills with every meal and just before bedtime 3. Use a Heating pad or Ice/Cold Pack a. 4-6 times a day b. May use warm bath/hottub  or showers 4. Try Gentle Massage and/or Stretching  a. at the area of pain many times a day b. stop if you feel pain - do not overdo it  Try these steps together to help you body heal faster and avoid making things get worse.  Doing just one of these things may not be enough.    If you are not getting better after two weeks or are noticing you are getting worse, contact our office for further advice; we may need to re-evaluate you & see what other things we can do to help.  STOP SMOKING!  We strongly recommend that you stop smoking.  Smoking increases the risk of surgery including infection in the form of an open wound, pus formation, abscess, hernia at an incision on the abdomen, etc.  You have an increased risk of other MAJOR complications such as stroke, heart attack, forming clots in the leg and/or lungs, and death.    Smoking Cessation Quitting smoking is important to your health and has many advantages. However, it is not always easy to quit since nicotine is a very addictive drug. Often times, people try 3 times or more before being able to quit. This document explains the best ways for you to prepare to quit smoking. Quitting takes hard work and a lot of effort, but you can do it. ADVANTAGES OF QUITTING SMOKING  You will live longer, feel better, and live better.  Your body will feel the impact of quitting  smoking almost immediately.  Within 20 minutes, blood pressure  decreases. Your pulse returns to its normal level.  After 8 hours, carbon monoxide levels in the blood return to normal. Your oxygen level increases.  After 24 hours, the chance of having a heart attack starts to decrease. Your breath, hair, and body stop smelling like smoke.  After 48 hours, damaged nerve endings begin to recover. Your sense of taste and smell improve.  After 72 hours, the body is virtually free of nicotine. Your bronchial tubes relax and breathing becomes easier.  After 2 to 12 weeks, lungs can hold more air. Exercise becomes easier and circulation improves.  The risk of having a heart attack, stroke, cancer, or lung disease is greatly reduced.  After 1 year, the risk of coronary heart disease is cut in half.  After 5 years, the risk of stroke falls to the same as a nonsmoker.  After 10 years, the risk of lung cancer is cut in half and the risk of other cancers decreases significantly.  After 15 years, the risk of coronary heart disease drops, usually to the level of a nonsmoker.  If you are pregnant, quitting smoking will improve your chances of having a healthy baby.  The people you live with, especially any children, will be healthier.  You will have extra money to spend on things other than cigarettes. QUESTIONS TO THINK ABOUT BEFORE ATTEMPTING TO QUIT You may want to talk about your answers with your caregiver.  Why do you want to quit?  If you tried to quit in the past, what helped and what did not?  What will be the most difficult situations for you after you quit? How will you plan to handle them?  Who can help you through the tough times? Your family? Friends? A caregiver?  What pleasures do you get from smoking? What ways can you still get pleasure if you quit? Here are some questions to ask your caregiver:  How can you help me to be successful at quitting?  What medicine do you think would be best for me and how should I take it?  What should  I do if I need more help?  What is smoking withdrawal like? How can I get information on withdrawal? GET READY  Set a quit date.  Change your environment by getting rid of all cigarettes, ashtrays, matches, and lighters in your home, car, or work. Do not let people smoke in your home.  Review your past attempts to quit. Think about what worked and what did not. GET SUPPORT AND ENCOURAGEMENT You have a better chance of being successful if you have help. You can get support in many ways.  Tell your family, friends, and co-workers that you are going to quit and need their support. Ask them not to smoke around you.  Get individual, group, or telephone counseling and support. Programs are available at [employer] and health centers. Call your local health department for information about programs in your area.  Spiritual beliefs and practices may help some smokers quit.  Download a "quit meter" on your computer to keep track of quit statistics, such as how long you have gone without smoking, cigarettes not smoked, and money saved.  Get a self-help book about quitting smoking and staying off of tobacco. Gracey yourself from urges to smoke. Talk to someone, go for a walk, or occupy your time with a task.  Change your normal routine. Take  a different route to work. Drink tea instead of coffee. Eat breakfast in a different place.  Reduce your stress. Take a hot bath, exercise, or read a book.  Plan something enjoyable to do every day. Reward yourself for not smoking.  Explore interactive web-based programs that specialize in helping you quit. GET MEDICINE AND USE IT CORRECTLY Medicines can help you stop smoking and decrease the urge to smoke. Combining medicine with the above behavioral methods and support can greatly increase your chances of successfully quitting smoking.  Nicotine replacement therapy helps deliver nicotine to your body without the  negative effects and risks of smoking. Nicotine replacement therapy includes nicotine gum, lozenges, inhalers, nasal sprays, and skin patches. Some may be available over-the-counter and others require a prescription.  Antidepressant medicine helps people abstain from smoking, but how this works is unknown. This medicine is available by prescription.  Nicotinic receptor partial agonist medicine simulates the effect of nicotine in your brain. This medicine is available by prescription. Ask your caregiver for advice about which medicines to use and how to use them based on your health history. Your caregiver will tell you what side effects to look out for if you choose to be on a medicine or therapy. Carefully read the information on the package. Do not use any other product containing nicotine while using a nicotine replacement product.  RELAPSE OR DIFFICULT SITUATIONS Most relapses occur within the first 3 months after quitting. Do not be discouraged if you start smoking again. Remember, most people try several times before finally quitting. You may have symptoms of withdrawal because your body is used to nicotine. You may crave cigarettes, be irritable, feel very hungry, cough often, get headaches, or have difficulty concentrating. The withdrawal symptoms are only temporary. They are strongest when you first quit, but they will go away within 10 14 days. To reduce the chances of relapse, try to:  Avoid drinking alcohol. Drinking lowers your chances of successfully quitting.  Reduce the amount of caffeine you consume. Once you quit smoking, the amount of caffeine in your body increases and can give you symptoms, such as a rapid heartbeat, sweating, and anxiety.  Avoid smokers because they can make you want to smoke.  Do not let weight gain distract you. Many smokers will gain weight when they quit, usually less than 10 pounds. Eat a healthy diet and stay active. You can always lose the weight gained  after you quit.  Find ways to improve your mood other than smoking. FOR MORE INFORMATION  www.smokefree.gov    While it can be one of the most difficult things to do, the [place] community has programs to help you stop.  Consider talking with your primary care physician about options.  Also, Smoking Cessation classes are available through the [facility]:  The smoking cessation program is a proven-effective program from the American Lung Association. The program is available for anyone 52 and older who currently smokes. The program lasts for 7 weeks and is 8 sessions. Each class will be approximately 1 1/2 hours. The program is every Tuesday.  All classes are 12-1:30pm and same location.  Event Location Information:  Location: [facility] 2nd Floor Conference Room 2-037; located next to [facility] cross streets: [place] Entrance into the [facility] is adjacent to the [employer] main entrance. The conference room is located on the 2nd floor.  Parking Instructions: Visitor parking is  adjacent to [employer] main entrance and the [place]    A smoking cessation program is also offered through the [facility] Register online at ClickDebate.gl or call [phone] for more information.   Tobacco cessation counseling is available at [facility]. Call [phone] for a free appointment.   Tobacco cessation classes also are available through the [place] in [place]. For information, call [phone].   The Patient Education Network features videos on tobacco cessation. Please consult your listings in the center of this book to find instructions on how to access this resource.   If you want more information, ask your nurse.

## 2018-10-27 ENCOUNTER — Other Ambulatory Visit: Payer: Self-pay | Admitting: Otolaryngology

## 2018-10-27 DIAGNOSIS — H9122 Sudden idiopathic hearing loss, left ear: Secondary | ICD-10-CM

## 2018-11-19 ENCOUNTER — Other Ambulatory Visit: Payer: Self-pay

## 2018-11-19 ENCOUNTER — Ambulatory Visit
Admission: RE | Admit: 2018-11-19 | Discharge: 2018-11-19 | Disposition: A | Discharge status: Home / Self Care | Payer: Medicare Other | Source: Ambulatory Visit | Attending: Otolaryngology | Admitting: Otolaryngology

## 2018-11-19 DIAGNOSIS — H9122 Sudden idiopathic hearing loss, left ear: Secondary | ICD-10-CM

## 2018-11-19 MED ORDER — GADOBENATE DIMEGLUMINE 529 MG/ML IV SOLN
17.0000 mL | Freq: Once | INTRAVENOUS | Status: AC | PRN
  Administered 2018-11-19: 17 mL via INTRAVENOUS

## 2019-01-18 ENCOUNTER — Telehealth: Payer: Self-pay | Admitting: Interventional Cardiology

## 2019-01-18 NOTE — Telephone Encounter (Signed)
Pt was a former [name] pt, last seen in 2016.  Per Clinical Supervisor, this pt will be considered a new patient.  Pt is needing to be scheduled tomorrow to be seen per DOD [name], for call placed to her today from [name], stating that the pt has dizziness and may be in possible afib.  Pt is scheduled on DOD Schedule to see Dr. Radford Pax tomorrow, as a new pt, on 10/7 at 1 pm.  Pt is aware to arrive 15 mins prior to this appt and wear his facial mask to the appt, and during the entire duration of the visit.  Pt verbalized understanding and agrees with this plan.  Will send a message to chart prep to obtain any important records regarding this pt.

## 2019-01-18 NOTE — Telephone Encounter (Signed)
New Buyer, retail from [facility] is calling in to get patient scheduled for an appointment to be checked for AFIB. Requesting an appointment for today or tomorrow, no appointment available. Scheduled for first available with [name] on February 10, 2019 at 3:20 pm. Patient added to waitlist for possible sooner appointment. If patient can be squeezed in, please give patient a call.

## 2019-01-19 ENCOUNTER — Other Ambulatory Visit: Payer: Self-pay

## 2019-01-19 ENCOUNTER — Encounter (INDEPENDENT_AMBULATORY_CARE_PROVIDER_SITE_OTHER): Payer: Self-pay

## 2019-01-19 ENCOUNTER — Ambulatory Visit: Payer: Medicare Other | Admitting: Cardiology

## 2019-01-19 ENCOUNTER — Encounter: Payer: Self-pay | Admitting: Cardiology

## 2019-01-19 VITALS — BP 154/60 | HR 83 | Ht 71.0 in | Wt 192.0 lb

## 2019-01-19 DIAGNOSIS — I493 Ventricular premature depolarization: Secondary | ICD-10-CM | POA: Diagnosis not present

## 2019-01-19 DIAGNOSIS — I359 Nonrheumatic aortic valve disorder, unspecified: Secondary | ICD-10-CM

## 2019-01-19 DIAGNOSIS — I10 Essential (primary) hypertension: Secondary | ICD-10-CM | POA: Diagnosis not present

## 2019-01-19 DIAGNOSIS — R42 Dizziness and giddiness: Secondary | ICD-10-CM | POA: Diagnosis not present

## 2019-01-19 NOTE — Patient Instructions (Signed)
Medication Instructions:   Your physician recommends that you continue on your current medications as directed. Please refer to the Current Medication list given to you today.  If you need a refill on your cardiac medications before your next appointment, please call your pharmacy.   .  Testing/Procedures:  Your physician has requested that you have an echocardiogram. Echocardiography is a painless test that uses sound waves to create images of your heart. It provides your doctor with information about the size and shape of your heart and how well your heart's chambers and valves are working. This procedure takes approximately one hour. There are no restrictions for this procedure.   Your physician has recommended that you wear an event monitor. Event monitors are medical devices that record the heart's electrical activity. Doctors most often [place] these monitors to diagnose arrhythmias. Arrhythmias are problems with the speed or rhythm of the heartbeat. The monitor is a small, portable device. You can wear one while you do your normal daily activities. This is usually used to diagnose what is causing palpitations/syncope (passing out).    Follow-Up: At [facility], you and your health needs are our priority.  As part of our continuing mission to provide you with exceptional heart care, we have created designated Provider Care Teams.  These Care Teams include your primary Cardiologist (physician) and Advanced Practice Providers (APPs -  Physician Assistants and Nurse Practitioners) who all work together to provide you with the care you need, when you need it. You will need a follow up appointment in 12 months Dr. Radford Pax.  Please call our office 2 months in advance to schedule this appointment.  You may see Dr. Radford Pax or one of the following Advanced Practice Providers on your designated Care Team:   [place], PA-C Melina Copa, PA-C . Ermalinda Barrios, PA-C

## 2019-01-19 NOTE — Progress Notes (Signed)
Cardiology Office Note    Date:  01/19/2019   ID:  Jacob Christian, DOB [birth year], MRN [id number]  PCP:  Aurea Graff.Marlou Sa, MD  Cardiologist:  Fransico Him, MD   Chief Complaint  Patient presents with  . New Patient (Initial Visit)    Aortic stenosis    History of Present Illness:  Jacob Christian is a 78 y.o. male who is being seen today for the evaluation of dizziness at the request of Alroy Dust, L.Marlou Sa, MD.  This is a 78yo male with a hx of moderate aortic stenosis, HTN, PVD, COPD and anxiety with panic attacks.  He was seen by ENT in August and referred back to Cardiology due to dizziness and possible arrhythmia.  He denies any chest pain or pressure, SOB, DOE, PND, orthopnea, LE edema,  palpitations or syncope. He says that Dr. Erik Obey heard an irregular heart beat on exam and was worried that he may have PAF.  He also has a hx of AS in the past and has not had an echo recently . He says that he has been seeing Dr. Erik Obey for problems with an infection in his ear.  He has been getting steroid injections in his ear and says that ever since that started he has had these dizzy spells.  He describes it as feeling like he has motion sickness.  He says each time he gets his ear manipulated the dizziness come on again.  He is compliant with his meds and is tolerating meds with no SE.     Past Medical History:  Diagnosis Date  . Anxiety   . Aortic stenosis   . Arthritis   . COPD (chronic obstructive pulmonary disease) ([place])    "borderline"  . Dermatitis   . GERD (gastroesophageal reflux disease)   . Hearing loss   . Heart murmur    "leaky valve" being monitored  . Hypertension   . Leg lesion   . Peripheral vascular disease ([place])   . Retroperitoneal mass LUQ s/p lap-assisted resection & left adrenalectomy 05/12/2014 05/12/2014  . Sciatica    "occasional"-right side    Past Surgical History:  Procedure Laterality Date  . LAPAROSCOPIC ADRENALECTOMY Left 05/12/2014   Procedure: LAPAROSCOPIC LEFT ADRENALECTOMY;  Surgeon: Michael Boston, MD;  Location: [facility];  Service: General;  Laterality: Left;  . Leg fracture repair Right 1996   metal plate in place, with bone graft    Current Medications: Current Meds  Medication Sig  . ALPRAZolam (XANAX) 0.5 MG tablet Take 0.5 mg by mouth 3 (three) times daily as needed for anxiety.  Marland Kitchen amLODipine (NORVASC) 5 MG tablet Take 5 mg by mouth daily.  Marland Kitchen etodolac (LODINE) 400 MG tablet Take 400 mg by mouth 2 (two) times daily as needed for mild pain.   Marland Kitchen HYDROcodone-acetaminophen (NORCO/VICODIN) 5-325 MG per tablet Take 0.5-1 tablets by mouth 2 (two) times daily as needed (Pain). Take as needed for pain  . lisinopril (PRINIVIL,ZESTRIL) 40 MG tablet Take 40 mg by mouth daily.  Marland Kitchen loratadine (CLARITIN) 10 MG tablet Take 10 mg by mouth daily.  . montelukast (SINGULAIR) 10 MG tablet Take 10 mg by mouth at bedtime.  . Multiple Vitamin (MULTIVITAMIN WITH MINERALS) TABS tablet Take 1 tablet by mouth daily.  . Multiple Vitamin (ONE-A-DAY MENS PO) Take by mouth.  . naproxen (NAPROSYN) 500 MG tablet Take 500 mg by mouth 2 (two) times daily with a meal.  . pantoprazole (PROTONIX) 40 MG tablet Take 40 mg by mouth  daily.   . polyvinyl alcohol (LIQUIFILM TEARS) 1.4 % ophthalmic solution Place 1 drop into both eyes 3 (three) times daily as needed for dry eyes.  . Potassium Gluconate 595 MG CAPS Take 1 capsule by mouth every other day.  . sertraline (ZOLOFT) 100 MG tablet Take 100 mg by mouth daily.  . vitamin B-12 (CYANOCOBALAMIN) 1000 MCG tablet Take 1,000 mcg by mouth daily.  . vitamin C (ASCORBIC ACID) 500 MG tablet Take 500 mg by mouth daily.  Marland Kitchen VITAMIN D, CHOLECALCIFEROL, PO Take 25 mcg by mouth daily.  . vitamin E (VITAMIN E) 400 UNIT capsule Take 400 Units by mouth daily.    Allergies:   Citalopram hydrobromide, Codeine, Dilaudid [hydromorphone hcl], Tramadol, and Vicodin [hydrocodone-acetaminophen]   Social History    Socioeconomic History  . Marital status: Widowed    Spouse name: Not on file  . Number of children: Not on file  . Years of education: Not on file  . Highest education level: Not on file  Occupational History  . Not on file  Social Needs  . Financial resource strain: Not on file  . Food insecurity    Worry: Not on file    Inability: Not on file  . Transportation needs    Medical: Not on file    Non-medical: Not on file  Tobacco Use  . Smoking status: Current Every Day Smoker    Packs/day: 0.50    Years: 50.00    Pack years: 25.00    Types: Cigarettes  . Smokeless tobacco: Never Used  Substance and Sexual Activity  . Alcohol use: Yes    Comment: occasional  . Drug use: No    Comment: quit marijuana in 2014  . Sexual activity: Not on file  Lifestyle  . Physical activity    Days per week: Not on file    Minutes per session: Not on file  . Stress: Not on file  Relationships  . Social Herbalist on phone: Not on file    Gets together: Not on file    Attends religious service: Not on file    Active member of club or organization: Not on file    Attends meetings of clubs or organizations: Not on file    Relationship status: Not on file  Other Topics Concern  . Not on file  Social History Narrative  . Not on file     Family History:  The patient's family history includes CVA in his brother and father; Colon cancer in his brother; Hypertension in his father; Pancreatic cancer in his maternal uncle; Throat cancer in his maternal grandmother and mother.   ROS:   Please see the history of present illness.    ROS All other systems reviewed and are negative.  No flowsheet data found.     PHYSICAL EXAM:   VS:  BP (!) 154/60   Pulse 83   Ht 5\' 11"  (1.803 m)   Wt 192 lb (87.1 kg)   BMI 26.78 kg/m    GEN: Well nourished, well developed, in no acute distress  HEENT: normal  Neck: no JVD, carotid bruits, or masses Cardiac: RRR; no murmurs, rubs, or  gallops,no edema.  Intact distal pulses bilaterally.  Respiratory:  clear to auscultation bilaterally, normal work of breathing GI: soft, nontender, nondistended, + BS MS: no deformity or atrophy  Skin: warm and dry, no rash Neuro:  Alert and Oriented x 3, Strength and sensation are intact Psych: euthymic mood, full  affect  Wt Readings from Last 3 Encounters:  01/19/19 192 lb (87.1 kg)  05/12/14 188 lb (85.3 kg)  05/10/14 188 lb (85.3 kg)      Studies/Labs Reviewed:   EKG:  EKG is ordered today.  The ekg ordered today demonstrates NSR with PACs and PVCs  Recent Labs: No results found for requested labs within last 8760 hours.   Lipid Panel No results found for: CHOL, TRIG, HDL, CHOLHDL, VLDL, LDLCALC, LDLDIRECT  Additional studies/ records that were reviewed today include:  Office notes from PCP and ENT    ASSESSMENT:    1. Dizziness   2. Aortic valve disorder   3. Benign essential HTN      PLAN:  In order of problems listed above:  1.  Dizziness -his dizziness does not sound cardiac -he says that his dizziness is identical to what he had when he had motion sickness -his dizziness is described as feeling off balance with the room and not feeling like he is going to pass out -he has PVCs on his EKG and PACs -I will get an event monitor to assess PVC load and rule out atrial arrhythmias -orthostatic BPs were normal  2.   Aortic stenosis -last echo 01/2014 showed normal LVF with moderate AS with AVA 1.12cm2. -repeat echo to make sure that this has not progressed  3.  HTN -BP mildly elevated today -continue Lisinopril 40mg  daily and amlodipine 5mg  daily    Medication Adjustments/Labs and Tests Ordered: Current medicines are reviewed at length with the patient today.  Concerns regarding medicines are outlined above.  Medication changes, Labs and Tests ordered today are listed in the Patient Instructions below.  There are no Patient Instructions on file for  this visit.   Signed, Fransico Him, MD  01/19/2019 1:12 PM    [facility] [place], [place], [place]  [address] Phone: [phone]; Fax: [phone]

## 2019-01-25 ENCOUNTER — Encounter: Payer: Self-pay | Admitting: Cardiology

## 2019-01-26 ENCOUNTER — Telehealth: Payer: Self-pay

## 2019-01-26 NOTE — Telephone Encounter (Signed)
Spoke with pt, went over monitor instructions. Verified address. 30 day [employer] Event Monitor ordered to be mailed to pt's home address.

## 2019-02-02 ENCOUNTER — Ambulatory Visit (HOSPITAL_COMMUNITY): Payer: Medicare Other | Attending: Cardiology

## 2019-02-02 ENCOUNTER — Other Ambulatory Visit: Payer: Self-pay

## 2019-02-02 DIAGNOSIS — R42 Dizziness and giddiness: Secondary | ICD-10-CM

## 2019-02-02 DIAGNOSIS — I493 Ventricular premature depolarization: Secondary | ICD-10-CM | POA: Diagnosis not present

## 2019-02-02 DIAGNOSIS — I10 Essential (primary) hypertension: Secondary | ICD-10-CM | POA: Diagnosis not present

## 2019-02-02 DIAGNOSIS — I359 Nonrheumatic aortic valve disorder, unspecified: Secondary | ICD-10-CM

## 2019-02-04 ENCOUNTER — Telehealth: Payer: Self-pay | Admitting: Cardiology

## 2019-02-04 NOTE — Telephone Encounter (Signed)
Result reviewed w/ pt.

## 2019-02-04 NOTE — Telephone Encounter (Signed)
Patient returned call for Echo results.  ?

## 2019-02-05 ENCOUNTER — Ambulatory Visit (INDEPENDENT_AMBULATORY_CARE_PROVIDER_SITE_OTHER): Payer: Medicare Other

## 2019-02-05 DIAGNOSIS — R42 Dizziness and giddiness: Secondary | ICD-10-CM | POA: Diagnosis not present

## 2019-02-05 DIAGNOSIS — I493 Ventricular premature depolarization: Secondary | ICD-10-CM

## 2019-02-10 ENCOUNTER — Ambulatory Visit: Payer: Medicare Other | Admitting: Interventional Cardiology

## 2019-02-17 ENCOUNTER — Telehealth: Payer: Self-pay | Admitting: Physician Assistant

## 2019-02-17 ENCOUNTER — Other Ambulatory Visit: Payer: Self-pay

## 2019-02-17 ENCOUNTER — Encounter (HOSPITAL_COMMUNITY): Payer: Self-pay | Admitting: Physician Assistant

## 2019-02-17 ENCOUNTER — Ambulatory Visit (HOSPITAL_COMMUNITY)
Admission: RE | Admit: 2019-02-17 | Discharge: 2019-02-17 | Disposition: A | Discharge status: Home / Self Care | Payer: Medicare Other | Source: Ambulatory Visit | Attending: Physician Assistant | Admitting: Physician Assistant

## 2019-02-17 VITALS — BP 118/68 | HR 95 | Ht 71.0 in | Wt 193.2 lb

## 2019-02-17 DIAGNOSIS — M1990 Unspecified osteoarthritis, unspecified site: Secondary | ICD-10-CM | POA: Diagnosis not present

## 2019-02-17 DIAGNOSIS — Z888 Allergy status to other drugs, medicaments and biological substances status: Secondary | ICD-10-CM | POA: Insufficient documentation

## 2019-02-17 DIAGNOSIS — J449 Chronic obstructive pulmonary disease, unspecified: Secondary | ICD-10-CM | POA: Diagnosis not present

## 2019-02-17 DIAGNOSIS — Z823 Family history of stroke: Secondary | ICD-10-CM | POA: Insufficient documentation

## 2019-02-17 DIAGNOSIS — D6869 Other thrombophilia: Secondary | ICD-10-CM | POA: Diagnosis not present

## 2019-02-17 DIAGNOSIS — I480 Paroxysmal atrial fibrillation: Secondary | ICD-10-CM | POA: Insufficient documentation

## 2019-02-17 DIAGNOSIS — K219 Gastro-esophageal reflux disease without esophagitis: Secondary | ICD-10-CM | POA: Insufficient documentation

## 2019-02-17 DIAGNOSIS — Z885 Allergy status to narcotic agent status: Secondary | ICD-10-CM | POA: Insufficient documentation

## 2019-02-17 DIAGNOSIS — Z79899 Other long term (current) drug therapy: Secondary | ICD-10-CM | POA: Insufficient documentation

## 2019-02-17 DIAGNOSIS — I350 Nonrheumatic aortic (valve) stenosis: Secondary | ICD-10-CM | POA: Insufficient documentation

## 2019-02-17 DIAGNOSIS — I10 Essential (primary) hypertension: Secondary | ICD-10-CM | POA: Diagnosis not present

## 2019-02-17 DIAGNOSIS — Z800 Family history of malignant neoplasm of digestive organs: Secondary | ICD-10-CM | POA: Diagnosis not present

## 2019-02-17 DIAGNOSIS — H9190 Unspecified hearing loss, unspecified ear: Secondary | ICD-10-CM | POA: Diagnosis not present

## 2019-02-17 DIAGNOSIS — I4892 Unspecified atrial flutter: Secondary | ICD-10-CM | POA: Diagnosis not present

## 2019-02-17 DIAGNOSIS — Z7901 Long term (current) use of anticoagulants: Secondary | ICD-10-CM | POA: Insufficient documentation

## 2019-02-17 DIAGNOSIS — I4891 Unspecified atrial fibrillation: Secondary | ICD-10-CM | POA: Diagnosis present

## 2019-02-17 DIAGNOSIS — I739 Peripheral vascular disease, unspecified: Secondary | ICD-10-CM | POA: Diagnosis not present

## 2019-02-17 DIAGNOSIS — Z8249 Family history of ischemic heart disease and other diseases of the circulatory system: Secondary | ICD-10-CM | POA: Insufficient documentation

## 2019-02-17 DIAGNOSIS — Z87891 Personal history of nicotine dependence: Secondary | ICD-10-CM | POA: Insufficient documentation

## 2019-02-17 DIAGNOSIS — F419 Anxiety disorder, unspecified: Secondary | ICD-10-CM | POA: Diagnosis not present

## 2019-02-17 LAB — BASIC METABOLIC PANEL
Anion gap: 12 (ref 5–15)
BUN: 9 mg/dL (ref 8–23)
CO2: 22 mmol/L (ref 22–32)
Calcium: 9.7 mg/dL (ref 8.9–10.3)
Chloride: 94 mmol/L — ABNORMAL LOW (ref 98–111)
Creatinine, Ser: 1.01 mg/dL (ref 0.61–1.24)
GFR calc Af Amer: >60 mL/min (ref >60)
GFR calc non Af Amer: >60 mL/min (ref >60)
Glucose, Bld: 105 mg/dL — ABNORMAL HIGH (ref 70–99)
Potassium: 4.5 mmol/L (ref 3.5–5.1)
Sodium: 128 mmol/L — ABNORMAL LOW (ref 135–145)

## 2019-02-17 LAB — CBC
HCT: 41 % (ref 39.0–52.0)
Hemoglobin: 14.1 g/dL (ref 13.0–17.0)
MCH: 32.2 pg (ref 26.0–34.0)
MCHC: 34.4 g/dL (ref 30.0–36.0)
MCV: 93.6 fL (ref 80.0–100.0)
Platelets: 413 10*3/uL — ABNORMAL HIGH (ref 150–400)
RBC: 4.38 MIL/uL (ref 4.22–5.81)
RDW: 12.4 % (ref 11.5–15.5)
WBC: 12.9 10*3/uL — ABNORMAL HIGH (ref 4.0–10.5)
nRBC: 0 % (ref 0.0–0.2)

## 2019-02-17 MED ORDER — DILTIAZEM HCL ER COATED BEADS 180 MG PO CP24: 180.0000 mg | ORAL_CAPSULE | Freq: Every day | ORAL | 11 refills | Status: DC

## 2019-02-17 MED ORDER — APIXABAN 5 MG PO TABS: 5.0000 mg | ORAL_TABLET | Freq: Two times a day (BID) | ORAL | 3 refills | Status: DC

## 2019-02-17 NOTE — Telephone Encounter (Signed)
Please get in to afib clinic today

## 2019-02-17 NOTE — Telephone Encounter (Signed)
Lm for pt to call back re recommendations./cy

## 2019-02-17 NOTE — Telephone Encounter (Signed)
Pt aware of recommendations and appt has been made with Adline Peals PA in the afib clinic at 3:00 pm today .Adonis Housekeeper

## 2019-02-17 NOTE — Progress Notes (Signed)
Primary Care Physician: Alroy Dust, L.Marlou Sa, MD Primary Cardiologist: Dr Radford Pax Primary Electrophysiologist: none Referring Physician: Dr Alden Server is a 78 y.o. male with a history of new onset paroxysmal atrial fibrillation, atrial flutter, PVD, HTN, and COPD who presents for consultation in the [facility].  The patient was initially diagnosed with atrial fibrillation 02/17/19 after it was noted on his event monitor. Patient was noted to have an irregular heart beat on exam at his ENT appointment which prompted a referral to cardiology. Initially, the monitor showed atrial flutter with variable conduction but at his visit today he is in atrial fibrillation. He does reports that he worked hard out in the yard yesterday and then drank two glasses of wine before bed. He has had some fatigue with exertion but denies palpitations or heart racing. He denies snoring. He has a CHADS2VASC score of 4.  Today, he denies symptoms of palpitations, chest pain, shortness of breath, orthopnea, PND, lower extremity edema, presyncope, syncope, snoring, daytime somnolence, bleeding, or neurologic sequela. The patient is tolerating medications without difficulties and is otherwise without complaint today.    Atrial Fibrillation Risk Factors:  he does not have symptoms or diagnosis of sleep apnea. he does not have a history of rheumatic fever. he does not have a history of alcohol use. The patient does not have a history of early familial atrial fibrillation or other arrhythmias.  he has a BMI of Body mass index is 26.95 kg/m.Marland Kitchen Filed Weights   02/17/19 1455  Weight: 87.6 kg    Family History  Problem Relation Age of Onset  . Throat cancer Mother   . CVA Father   . Hypertension Father   . CVA Brother   . Pancreatic cancer Maternal Uncle   . Throat cancer Maternal Grandmother   . Colon cancer Brother      Atrial Fibrillation Management history:   Previous antiarrhythmic drugs: none Previous cardioversions: none Previous ablations: none CHADS2VASC score: 4 Anticoagulation history: none   Past Medical History:  Diagnosis Date  . Anxiety   . Aortic stenosis   . Arthritis   . COPD (chronic obstructive pulmonary disease) ([place])    "borderline"  . Dermatitis   . GERD (gastroesophageal reflux disease)   . Hearing loss   . Heart murmur    "leaky valve" being monitored  . Hypertension   . Leg lesion   . Peripheral vascular disease ([place])   . Retroperitoneal mass LUQ s/p lap-assisted resection & left adrenalectomy 05/12/2014 05/12/2014  . Sciatica    "occasional"-right side   Past Surgical History:  Procedure Laterality Date  . LAPAROSCOPIC ADRENALECTOMY Left 05/12/2014   Procedure: LAPAROSCOPIC LEFT ADRENALECTOMY;  Surgeon: Michael Boston, MD;  Location: [facility];  Service: General;  Laterality: Left;  . Leg fracture repair Right 1996   metal plate in place, with bone graft    Current Outpatient Medications  Medication Sig Dispense Refill  . ALPRAZolam (XANAX) 0.5 MG tablet Take 0.5 mg by mouth 3 (three) times daily as needed for anxiety.    . Azelastine HCl 137 MCG/SPRAY SOLN USE 1 SRAY IN EACH NOSTRIL TWICE A DAY AS NEEDED    . etodolac (LODINE) 400 MG tablet Take 400 mg by mouth 2 (two) times daily as needed for mild pain.     Marland Kitchen HYDROcodone-acetaminophen (NORCO/VICODIN) 5-325 MG per tablet Take 0.5-1 tablets by mouth 2 (two) times daily as needed (Pain). Take as needed for pain    .  lisinopril (PRINIVIL,ZESTRIL) 40 MG tablet Take 40 mg by mouth daily.    Marland Kitchen loratadine (CLARITIN) 10 MG tablet Take 10 mg by mouth daily.    . meclizine (ANTIVERT) 25 MG tablet TAKE 1 TABLET BY MOUTH 4 TIMES A DAY AS NEEDED FOR DIZZINESS    . Multiple Vitamin (ONE-A-DAY MENS PO) Take by mouth.    . pantoprazole (PROTONIX) 40 MG tablet Take 40 mg by mouth daily.     . polyvinyl alcohol (LIQUIFILM TEARS) 1.4 % ophthalmic solution Place 1 drop into both  eyes 3 (three) times daily as needed for dry eyes.    . Potassium Gluconate 595 MG CAPS Take 1 capsule by mouth every other day.    . sertraline (ZOLOFT) 100 MG tablet Take 100 mg by mouth daily.    . vitamin B-12 (CYANOCOBALAMIN) 1000 MCG tablet Take 1,000 mcg by mouth daily.    . vitamin C (ASCORBIC ACID) 500 MG tablet Take 500 mg by mouth daily.    Marland Kitchen VITAMIN D, CHOLECALCIFEROL, PO Take 25 mcg by mouth daily.    . vitamin E (VITAMIN E) 400 UNIT capsule Take 400 Units by mouth daily.    Marland Kitchen apixaban (ELIQUIS) 5 MG TABS tablet Take 1 tablet (5 mg total) by mouth 2 (two) times daily. 60 tablet 3  . diltiazem (CARDIZEM CD) 180 MG 24 hr capsule Take 1 capsule (180 mg total) by mouth daily. 30 capsule 11   No current facility-administered medications for this encounter.     Allergies  Allergen Reactions  . Citalopram Hydrobromide     Pt did not like the way the pill made him feel " just didn't care about anything"  . Codeine Itching    PT denies Codeine allergy  . Dilaudid [Hydromorphone Hcl]   . Tramadol   . Vicodin [Hydrocodone-Acetaminophen]     Social History   Socioeconomic History  . Marital status: Widowed    Spouse name: Not on file  . Number of children: Not on file  . Years of education: Not on file  . Highest education level: Not on file  Occupational History  . Not on file  Social Needs  . Financial resource strain: Not on file  . Food insecurity    Worry: Not on file    Inability: Not on file  . Transportation needs    Medical: Not on file    Non-medical: Not on file  Tobacco Use  . Smoking status: Former Smoker    Packs/day: 0.50    Years: 50.00    Pack years: 25.00    Types: Cigarettes  . Smokeless tobacco: Never Used  Substance and Sexual Activity  . Alcohol use: Yes    Alcohol/week: 2.0 - 3.0 standard drinks    Types: 2 - 3 Glasses of wine per week  . Drug use: No    Comment: quit marijuana in 2014  . Sexual activity: Not on file  Lifestyle  .  Physical activity    Days per week: Not on file    Minutes per session: Not on file  . Stress: Not on file  Relationships  . Social Herbalist on phone: Not on file    Gets together: Not on file    Attends religious service: Not on file    Active member of club or organization: Not on file    Attends meetings of clubs or organizations: Not on file    Relationship status: Not on file  .  Intimate partner violence    Fear of current or ex partner: Not on file    Emotionally abused: Not on file    Physically abused: Not on file    Forced sexual activity: Not on file  Other Topics Concern  . Not on file  Social History Narrative  . Not on file     ROS- All systems are reviewed and negative except as per the HPI above.  Physical Exam: Vitals:   02/17/19 1455  BP: 118/68  Pulse: 95  Weight: 87.6 kg  Height: 5\' 11"  (1.803 m)    GEN- The patient is well appearing elderly male, alert and oriented x 3 today.   Head- normocephalic, atraumatic Eyes-  Sclera clear, conjunctiva pink Ears- hearing intact Oropharynx- clear Neck- supple  Lungs- Clear to ausculation bilaterally, normal work of breathing Heart- irregular rate and rhythm, no murmurs, rubs or gallops  GI- soft, NT, ND, + BS Extremities- no clubbing, cyanosis, or edema MS- no significant deformity or atrophy Skin- no rash or lesion Psych- euthymic mood, full affect Neuro- strength and sensation are intact  Wt Readings from Last 3 Encounters:  02/17/19 87.6 kg  01/19/19 87.1 kg  05/12/14 85.3 kg    EKG today demonstrates afib HR 95, QRS 92, QTc 432  Echo 02/02/19 demonstrated  1. Left ventricular ejection fraction, by visual estimation, is 55 to 60%. The left ventricle has normal function. Normal left ventricular size. There is moderately increased left ventricular hypertrophy. Normal wall motion.  2. Left ventricular diastolic Doppler parameters are consistent with pseudonormalization pattern of LV  diastolic filling.  3. Moderate calcification of the mitral valve leaflet(s).  4. Moderate mitral annular calcification.  5. The mitral valve is degenerative. Trivial mitral valve regurgitation. No evidence of mitral stenosis. Mean gradient 3.5 mmHg across mitral valve.  6. The aortic valve is tricuspid. Aortic valve regurgitation is mild by color flow Doppler. Moderate aortic valve stenosis. Mean gradient 26 mmHg with AVA 1.25 cm^2  7. Left atrial size was moderately dilated.  8. Global right ventricle has normal systolic function.The right ventricular size is normal. No increase in right ventricular wall thickness.  9. Right atrial size was normal. 10. The tricuspid valve is normal in structure. Tricuspid valve regurgitation is trivial. 11. The inferior vena cava is normal in size with greater than 50% respiratory variability, suggesting right atrial pressure of 3 mmHg. 12. TR signal is inadequate for assessing pulmonary artery systolic pressure.  Epic records are reviewed at length today  Assessment and Plan:  1. New onset atrial fibrillation/atrial flutter General education about afib provided and questions answered.  We also discussed his stroke risk and the risks and benefits of anticoagulation. Will start Eliquis 5 mg BID. Check Bmet/CBC. Will stop Norvasc and start diltiazem 180 mg daily for rate control.  Will have patient finish event monitor to get an idea of AF burden before considering AAD. Lifestyle modification was discussed and encouraged including regular physical activity and weight reduction.  This patients CHA2DS2-VASc Score and unadjusted Ischemic Stroke Rate (% per year) is equal to 4.8 % stroke rate/year from a score of 4  Above score calculated as 1 point each if present [CHF, HTN, DM, Vascular=MI/PAD/Aortic Plaque, Age if 65-74, or Male] Above score calculated as 2 points each if present [Age > 75, or Stroke/TIA/TE]   2. HTN Stable, med changes as above.   3. Aortic stenosis Moderate by recent echo. Followed by Dr Radford Pax.   Follow up in  the AF clinic in one month.   [facility] [address] [place], [place] [address] [phone] 02/17/2019 3:28 PM

## 2019-02-17 NOTE — Telephone Encounter (Signed)
Call received from [employer]. Heart monitor detected atrial flutter with variable conduction at 100 bpm. Attempted to reach the patient, no answer. Aflutter started at 5:48 AM [place] standard time. [employer] is awaiting callback.   I also attempted to call the patient - no answer, left VM. I will route to Dr. Radford Pax.

## 2019-02-17 NOTE — Telephone Encounter (Signed)
Fax came in with monitor results below. Signed by DOD and given to Medical Records to scan to the patient's chart. Will continue with plan given by Dr. Radford Pax.

## 2019-02-17 NOTE — Patient Instructions (Signed)
Stop Amlodipine  Start Diltiazem 180mg  daily and Eliquis 5mg  twice a day(02/17/2019 only one dose with supper)

## 2019-03-23 ENCOUNTER — Ambulatory Visit (HOSPITAL_COMMUNITY)
Admission: RE | Admit: 2019-03-23 | Discharge: 2019-03-23 | Disposition: A | Discharge status: Home / Self Care | Payer: Medicare Other | Source: Ambulatory Visit | Attending: Physician Assistant | Admitting: Physician Assistant

## 2019-03-23 ENCOUNTER — Other Ambulatory Visit: Payer: Self-pay

## 2019-03-23 ENCOUNTER — Encounter (HOSPITAL_COMMUNITY): Payer: Self-pay | Admitting: Physician Assistant

## 2019-03-23 VITALS — BP 130/66 | HR 61 | Ht 71.0 in | Wt 188.6 lb

## 2019-03-23 DIAGNOSIS — I350 Nonrheumatic aortic (valve) stenosis: Secondary | ICD-10-CM | POA: Insufficient documentation

## 2019-03-23 DIAGNOSIS — I491 Atrial premature depolarization: Secondary | ICD-10-CM | POA: Insufficient documentation

## 2019-03-23 DIAGNOSIS — Z87891 Personal history of nicotine dependence: Secondary | ICD-10-CM | POA: Insufficient documentation

## 2019-03-23 DIAGNOSIS — Z8249 Family history of ischemic heart disease and other diseases of the circulatory system: Secondary | ICD-10-CM | POA: Insufficient documentation

## 2019-03-23 DIAGNOSIS — I739 Peripheral vascular disease, unspecified: Secondary | ICD-10-CM | POA: Insufficient documentation

## 2019-03-23 DIAGNOSIS — Z79899 Other long term (current) drug therapy: Secondary | ICD-10-CM | POA: Insufficient documentation

## 2019-03-23 DIAGNOSIS — F419 Anxiety disorder, unspecified: Secondary | ICD-10-CM | POA: Insufficient documentation

## 2019-03-23 DIAGNOSIS — J449 Chronic obstructive pulmonary disease, unspecified: Secondary | ICD-10-CM | POA: Insufficient documentation

## 2019-03-23 DIAGNOSIS — Z823 Family history of stroke: Secondary | ICD-10-CM | POA: Diagnosis not present

## 2019-03-23 DIAGNOSIS — Z7901 Long term (current) use of anticoagulants: Secondary | ICD-10-CM | POA: Insufficient documentation

## 2019-03-23 DIAGNOSIS — Z888 Allergy status to other drugs, medicaments and biological substances status: Secondary | ICD-10-CM | POA: Diagnosis not present

## 2019-03-23 DIAGNOSIS — K219 Gastro-esophageal reflux disease without esophagitis: Secondary | ICD-10-CM | POA: Insufficient documentation

## 2019-03-23 DIAGNOSIS — Z800 Family history of malignant neoplasm of digestive organs: Secondary | ICD-10-CM | POA: Insufficient documentation

## 2019-03-23 DIAGNOSIS — M1990 Unspecified osteoarthritis, unspecified site: Secondary | ICD-10-CM | POA: Diagnosis not present

## 2019-03-23 DIAGNOSIS — I10 Essential (primary) hypertension: Secondary | ICD-10-CM | POA: Diagnosis not present

## 2019-03-23 DIAGNOSIS — D6869 Other thrombophilia: Secondary | ICD-10-CM | POA: Diagnosis not present

## 2019-03-23 DIAGNOSIS — I480 Paroxysmal atrial fibrillation: Secondary | ICD-10-CM | POA: Diagnosis not present

## 2019-03-23 DIAGNOSIS — I4892 Unspecified atrial flutter: Secondary | ICD-10-CM | POA: Insufficient documentation

## 2019-03-23 DIAGNOSIS — Z885 Allergy status to narcotic agent status: Secondary | ICD-10-CM | POA: Diagnosis not present

## 2019-03-23 NOTE — Progress Notes (Signed)
Primary Care Physician: Alroy Dust, L.Marlou Sa, MD Primary Cardiologist: Dr Radford Pax Primary Electrophysiologist: none Referring Physician: Dr Alden Server is a 78 y.o. male with a history of new onset paroxysmal atrial fibrillation, atrial flutter, PVD, HTN, and COPD who presents for consultation in the [facility].  The patient was initially diagnosed with atrial fibrillation 02/17/19 after it was noted on his event monitor. Patient was noted to have an irregular heart beat on exam at his ENT appointment which prompted a referral to cardiology. The event monitor showed both atrial flutter and atrial fibrillation. He has had some fatigue with exertion but denies palpitations or heart racing. He denies snoring. He has a CHADS2VASC score of 4.  On follow up today, patient had 11% afib burden on his event monitor. He did have rapid rates at times but the median rate was controlled. He reports that he feels better and better each day with no palpitations and less fatigue with exertion. He denies SOB or dizziness. Patient does report that he was receiving steroid injections for an ear condition just prior to the onset of his symptoms.  Today, he denies symptoms of palpitations, chest pain, shortness of breath, orthopnea, PND, lower extremity edema, presyncope, syncope, snoring, daytime somnolence, bleeding, or neurologic sequela. The patient is tolerating medications without difficulties and is otherwise without complaint today.    Atrial Fibrillation Risk Factors:  he does not have symptoms or diagnosis of sleep apnea. he does not have a history of rheumatic fever. he does not have a history of alcohol use. The patient does not have a history of early familial atrial fibrillation or other arrhythmias.  he has a BMI of Body mass index is 26.3 kg/m.Marland Kitchen Filed Weights   03/23/19 1421  Weight: 85.5 kg    Family History  Problem Relation Age of Onset  .  Throat cancer Mother   . CVA Father   . Hypertension Father   . CVA Brother   . Pancreatic cancer Maternal Uncle   . Throat cancer Maternal Grandmother   . Colon cancer Brother      Atrial Fibrillation Management history:  Previous antiarrhythmic drugs: none Previous cardioversions: none Previous ablations: none CHADS2VASC score: 4 Anticoagulation history: none   Past Medical History:  Diagnosis Date  . Anxiety   . Aortic stenosis   . Arthritis   . COPD (chronic obstructive pulmonary disease) ([place])    "borderline"  . Dermatitis   . GERD (gastroesophageal reflux disease)   . Hearing loss   . Heart murmur    "leaky valve" being monitored  . Hypertension   . Leg lesion   . Peripheral vascular disease ([place])   . Retroperitoneal mass LUQ s/p lap-assisted resection & left adrenalectomy 05/12/2014 05/12/2014  . Sciatica    "occasional"-right side   Past Surgical History:  Procedure Laterality Date  . LAPAROSCOPIC ADRENALECTOMY Left 05/12/2014   Procedure: LAPAROSCOPIC LEFT ADRENALECTOMY;  Surgeon: Michael Boston, MD;  Location: [facility];  Service: General;  Laterality: Left;  . Leg fracture repair Right 1996   metal plate in place, with bone graft    Current Outpatient Medications  Medication Sig Dispense Refill  . ALPRAZolam (XANAX) 0.5 MG tablet Take 0.5 mg by mouth 3 (three) times daily as needed for anxiety.    Marland Kitchen apixaban (ELIQUIS) 5 MG TABS tablet Take 1 tablet (5 mg total) by mouth 2 (two) times daily. 60 tablet 3  . Azelastine HCl 137 MCG/SPRAY SOLN  USE 1 SRAY IN EACH NOSTRIL TWICE A DAY AS NEEDED    . diltiazem (CARDIZEM CD) 180 MG 24 hr capsule Take 1 capsule (180 mg total) by mouth daily. 30 capsule 11  . etodolac (LODINE) 400 MG tablet Take 400 mg by mouth 2 (two) times daily as needed for mild pain.     Marland Kitchen HYDROcodone-acetaminophen (NORCO/VICODIN) 5-325 MG per tablet Take 0.5-1 tablets by mouth 2 (two) times daily as needed (Pain). Take as needed for pain    .  lisinopril (PRINIVIL,ZESTRIL) 40 MG tablet Take 40 mg by mouth daily.    Marland Kitchen loratadine (CLARITIN) 10 MG tablet Take 10 mg by mouth daily.    . meclizine (ANTIVERT) 25 MG tablet TAKE 1 TABLET BY MOUTH 4 TIMES A DAY AS NEEDED FOR DIZZINESS    . Multiple Vitamin (ONE-A-DAY MENS PO) Take by mouth.    . pantoprazole (PROTONIX) 40 MG tablet Take 40 mg by mouth daily.     . polyvinyl alcohol (LIQUIFILM TEARS) 1.4 % ophthalmic solution Place 1 drop into both eyes 3 (three) times daily as needed for dry eyes.    . Potassium Gluconate 595 MG CAPS Take 1 capsule by mouth every other day.    . sertraline (ZOLOFT) 100 MG tablet Take 100 mg by mouth daily.    . vitamin B-12 (CYANOCOBALAMIN) 1000 MCG tablet Take 1,000 mcg by mouth daily.    . vitamin C (ASCORBIC ACID) 500 MG tablet Take 500 mg by mouth daily.    Marland Kitchen VITAMIN D, CHOLECALCIFEROL, PO Take 25 mcg by mouth daily.    . vitamin E (VITAMIN E) 400 UNIT capsule Take 400 Units by mouth daily.     No current facility-administered medications for this encounter.     Allergies  Allergen Reactions  . Citalopram Hydrobromide     Pt did not like the way the pill made him feel " just didn't care about anything"  . Codeine Itching    PT denies Codeine allergy  . Dilaudid [Hydromorphone Hcl]   . Tramadol   . Vicodin [Hydrocodone-Acetaminophen]     Social History   Socioeconomic History  . Marital status: Widowed    Spouse name: Not on file  . Number of children: Not on file  . Years of education: Not on file  . Highest education level: Not on file  Occupational History  . Not on file  Social Needs  . Financial resource strain: Not on file  . Food insecurity    Worry: Not on file    Inability: Not on file  . Transportation needs    Medical: Not on file    Non-medical: Not on file  Tobacco Use  . Smoking status: Former Smoker    Packs/day: 0.50    Years: 50.00    Pack years: 25.00    Types: Cigarettes  . Smokeless tobacco: Never Used   Substance and Sexual Activity  . Alcohol use: Yes    Alcohol/week: 2.0 - 3.0 standard drinks    Types: 2 - 3 Glasses of wine per week  . Drug use: No    Comment: quit marijuana in 2014  . Sexual activity: Not on file  Lifestyle  . Physical activity    Days per week: Not on file    Minutes per session: Not on file  . Stress: Not on file  Relationships  . Social Herbalist on phone: Not on file    Gets together: Not on file  Attends religious service: Not on file    Active member of club or organization: Not on file    Attends meetings of clubs or organizations: Not on file    Relationship status: Not on file  . Intimate partner violence    Fear of current or ex partner: Not on file    Emotionally abused: Not on file    Physically abused: Not on file    Forced sexual activity: Not on file  Other Topics Concern  . Not on file  Social History Narrative  . Not on file     ROS- All systems are reviewed and negative except as per the HPI above.  Physical Exam: Vitals:   03/23/19 1421  BP: 130/66  Pulse: 61  Weight: 85.5 kg  Height: 5\' 11"  (1.803 m)    GEN- The patient is well appearing elderly male, alert and oriented x 3 today.   HEENT-head normocephalic, atraumatic, sclera clear, conjunctiva pink, hearing intact, trachea midline. Lungs- Clear to ausculation bilaterally, normal work of breathing Heart- Regular rate and rhythm, no murmurs, rubs or gallops  GI- soft, NT, ND, + BS Extremities- no clubbing, cyanosis, or edema MS- no significant deformity or atrophy Skin- no rash or lesion Psych- euthymic mood, full affect Neuro- strength and sensation are intact   Wt Readings from Last 3 Encounters:  03/23/19 85.5 kg  02/17/19 87.6 kg  01/19/19 87.1 kg    EKG today demonstrates SR HR 61, 1st degree AV block, PAC, PR 270, QRS 88, QTc 426  Echo 02/02/19 demonstrated  1. Left ventricular ejection fraction, by visual estimation, is 55 to 60%. The left  ventricle has normal function. Normal left ventricular size. There is moderately increased left ventricular hypertrophy. Normal wall motion.  2. Left ventricular diastolic Doppler parameters are consistent with pseudonormalization pattern of LV diastolic filling.  3. Moderate calcification of the mitral valve leaflet(s).  4. Moderate mitral annular calcification.  5. The mitral valve is degenerative. Trivial mitral valve regurgitation. No evidence of mitral stenosis. Mean gradient 3.5 mmHg across mitral valve.  6. The aortic valve is tricuspid. Aortic valve regurgitation is mild by color flow Doppler. Moderate aortic valve stenosis. Mean gradient 26 mmHg with AVA 1.25 cm^2  7. Left atrial size was moderately dilated.  8. Global right ventricle has normal systolic function.The right ventricular size is normal. No increase in right ventricular wall thickness.  9. Right atrial size was normal. 10. The tricuspid valve is normal in structure. Tricuspid valve regurgitation is trivial. 11. The inferior vena cava is normal in size with greater than 50% respiratory variability, suggesting right atrial pressure of 3 mmHg. 12. TR signal is inadequate for assessing pulmonary artery systolic pressure.  Epic records are reviewed at length today  Assessment and Plan:  1. Paroxysmal atrial fibrillation/atrial flutter Recent heart monitor showed 11% afib burden.  We discussed therapeutic options including AAD therapy Multaq, dofetilide, and amiodarone. Would avoid class 1C given LVH. After discussing the risks and benefits, patient declined AAD and prefers to continue diltiazem for now as his symptoms have improved.  Continue Eliquis 5 mg BID. Continue diltiazem 180 mg daily.  This patients CHA2DS2-VASc Score and unadjusted Ischemic Stroke Rate (% per year) is equal to 4.8 % stroke rate/year from a score of 4  Above score calculated as 1 point each if present [CHF, HTN, DM, Vascular=MI/PAD/Aortic Plaque,  Age if 65-74, or Male] Above score calculated as 2 points each if present [Age > 75, or Stroke/TIA/TE]  2. HTN Stable, no changes today.  3. Aortic stenosis Moderate by last echo. Followed by Dr Radford Pax.   Follow up in the AF clinic in 3 months.   [facility] [address] [place], [place] [address] [phone] 03/23/2019 4:52 PM

## 2019-06-04 ENCOUNTER — Other Ambulatory Visit (HOSPITAL_COMMUNITY): Payer: Self-pay | Admitting: Physician Assistant

## 2019-06-12 ENCOUNTER — Ambulatory Visit: Payer: Medicare Other | Attending: Internal Medicine

## 2019-06-12 DIAGNOSIS — Z23 Encounter for immunization: Secondary | ICD-10-CM | POA: Insufficient documentation

## 2019-06-12 NOTE — Progress Notes (Signed)
   Covid-19 Vaccination Clinic  Name:  Jacob Christian    MRN: [id number] DOB: [birth year]  06/12/2019  Jacob Christian was observed post Covid-19 immunization for 15 minutes without incidence. He was provided with Vaccine Information Sheet and instruction to access the V-Safe system.   Jacob Christian was instructed to call 911 with any severe reactions post vaccine: Marland Kitchen Difficulty breathing  . Swelling of your face and throat  . A fast heartbeat  . A bad rash all over your body  . Dizziness and weakness    Immunizations Administered    Name Date Dose VIS Date Route   Pfizer COVID-19 Vaccine 06/12/2019  8:53 AM 0.3 mL 03/25/2019 Intramuscular   Manufacturer: [place]   Lot: [id number]   [place]: [id number]

## 2019-06-22 ENCOUNTER — Ambulatory Visit (HOSPITAL_COMMUNITY)
Admission: RE | Admit: 2019-06-22 | Discharge: 2019-06-22 | Disposition: A | Discharge status: Home / Self Care | Payer: Medicare Other | Source: Ambulatory Visit | Attending: Physician Assistant | Admitting: Physician Assistant

## 2019-06-22 ENCOUNTER — Other Ambulatory Visit: Payer: Self-pay

## 2019-06-22 VITALS — BP 146/60 | HR 68 | Ht 71.0 in | Wt 185.0 lb

## 2019-06-22 DIAGNOSIS — Z8249 Family history of ischemic heart disease and other diseases of the circulatory system: Secondary | ICD-10-CM | POA: Insufficient documentation

## 2019-06-22 DIAGNOSIS — I10 Essential (primary) hypertension: Secondary | ICD-10-CM | POA: Insufficient documentation

## 2019-06-22 DIAGNOSIS — M1990 Unspecified osteoarthritis, unspecified site: Secondary | ICD-10-CM | POA: Diagnosis not present

## 2019-06-22 DIAGNOSIS — Z885 Allergy status to narcotic agent status: Secondary | ICD-10-CM | POA: Diagnosis not present

## 2019-06-22 DIAGNOSIS — Z79899 Other long term (current) drug therapy: Secondary | ICD-10-CM | POA: Insufficient documentation

## 2019-06-22 DIAGNOSIS — H9190 Unspecified hearing loss, unspecified ear: Secondary | ICD-10-CM | POA: Diagnosis not present

## 2019-06-22 DIAGNOSIS — Z7901 Long term (current) use of anticoagulants: Secondary | ICD-10-CM | POA: Insufficient documentation

## 2019-06-22 DIAGNOSIS — I350 Nonrheumatic aortic (valve) stenosis: Secondary | ICD-10-CM | POA: Diagnosis not present

## 2019-06-22 DIAGNOSIS — Z87891 Personal history of nicotine dependence: Secondary | ICD-10-CM | POA: Diagnosis not present

## 2019-06-22 DIAGNOSIS — F419 Anxiety disorder, unspecified: Secondary | ICD-10-CM | POA: Insufficient documentation

## 2019-06-22 DIAGNOSIS — Z823 Family history of stroke: Secondary | ICD-10-CM | POA: Diagnosis not present

## 2019-06-22 DIAGNOSIS — I480 Paroxysmal atrial fibrillation: Secondary | ICD-10-CM | POA: Diagnosis not present

## 2019-06-22 DIAGNOSIS — D6869 Other thrombophilia: Secondary | ICD-10-CM | POA: Diagnosis not present

## 2019-06-22 DIAGNOSIS — J449 Chronic obstructive pulmonary disease, unspecified: Secondary | ICD-10-CM | POA: Insufficient documentation

## 2019-06-22 DIAGNOSIS — I4892 Unspecified atrial flutter: Secondary | ICD-10-CM | POA: Diagnosis not present

## 2019-06-22 DIAGNOSIS — Z888 Allergy status to other drugs, medicaments and biological substances status: Secondary | ICD-10-CM | POA: Diagnosis not present

## 2019-06-22 DIAGNOSIS — K219 Gastro-esophageal reflux disease without esophagitis: Secondary | ICD-10-CM | POA: Insufficient documentation

## 2019-06-22 DIAGNOSIS — I739 Peripheral vascular disease, unspecified: Secondary | ICD-10-CM | POA: Diagnosis not present

## 2019-06-22 NOTE — Progress Notes (Signed)
Primary Care Physician: Alroy Dust, L.Marlou Sa, MD Primary Cardiologist: Dr Radford Pax Primary Electrophysiologist: none Referring Physician: Dr Alden Server is a 79 y.o. male with a history of new onset paroxysmal atrial fibrillation, atrial flutter, PVD, HTN, and COPD who presents for follow up in the [facility].  The patient was initially diagnosed with atrial fibrillation 02/17/19 after it was noted on his event monitor. Patient was noted to have an irregular heart beat on exam at his ENT appointment which prompted a referral to cardiology. The event monitor showed both atrial flutter and atrial fibrillation. He has had some fatigue with exertion but denies palpitations or heart racing. He denies snoring. He has a CHADS2VASC score of 4. Patient had 11% afib burden on his event monitor. He did have rapid rates at times but the median rate was controlled. Patient does report that he was receiving steroid injections for an ear condition just prior to the onset of his symptoms. His symptoms improved on diltiazem.  On follow up today, patient reports that he has done well since his last visit. He denies any further heart racing or palpitations. He brings in a log of BP and heart rates which show excellent control. He is tolerating the medication without difficulty.   Today, he denies symptoms of palpitations, chest pain, shortness of breath, orthopnea, PND, lower extremity edema, presyncope, syncope, snoring, daytime somnolence, bleeding, or neurologic sequela. The patient is tolerating medications without difficulties and is otherwise without complaint today.    Atrial Fibrillation Risk Factors:  he does not have symptoms or diagnosis of sleep apnea. he does not have a history of rheumatic fever. he does not have a history of alcohol use. The patient does not have a history of early familial atrial fibrillation or other arrhythmias.  he has a BMI of Body  mass index is 25.8 kg/m.Marland Kitchen Filed Weights   06/22/19 1431  Weight: 83.9 kg    Family History  Problem Relation Age of Onset  . Throat cancer Mother   . CVA Father   . Hypertension Father   . CVA Brother   . Pancreatic cancer Maternal Uncle   . Throat cancer Maternal Grandmother   . Colon cancer Brother      Atrial Fibrillation Management history:  Previous antiarrhythmic drugs: none Previous cardioversions: none Previous ablations: none CHADS2VASC score: 4 Anticoagulation history: Eliquis   Past Medical History:  Diagnosis Date  . Anxiety   . Aortic stenosis   . Arthritis   . COPD (chronic obstructive pulmonary disease) ([place])    "borderline"  . Dermatitis   . GERD (gastroesophageal reflux disease)   . Hearing loss   . Heart murmur    "leaky valve" being monitored  . Hypertension   . Leg lesion   . Peripheral vascular disease ([place])   . Retroperitoneal mass LUQ s/p lap-assisted resection & left adrenalectomy 05/12/2014 05/12/2014  . Sciatica    "occasional"-right side   Past Surgical History:  Procedure Laterality Date  . LAPAROSCOPIC ADRENALECTOMY Left 05/12/2014   Procedure: LAPAROSCOPIC LEFT ADRENALECTOMY;  Surgeon: Michael Boston, MD;  Location: [facility];  Service: General;  Laterality: Left;  . Leg fracture repair Right 1996   metal plate in place, with bone graft    Current Outpatient Medications  Medication Sig Dispense Refill  . Azelastine HCl 137 MCG/SPRAY SOLN USE 1 SRAY IN EACH NOSTRIL TWICE A DAY AS NEEDED    . clonazePAM (KLONOPIN) 1 MG tablet Take  1 mg by mouth 3 (three) times daily as needed.    . diltiazem (CARDIZEM CD) 180 MG 24 hr capsule Take 1 capsule (180 mg total) by mouth daily. 30 capsule 11  . ELIQUIS 5 MG TABS tablet TAKE 1 TABLET BY MOUTH TWICE A DAY 60 tablet 3  . HYDROcodone-acetaminophen (NORCO/VICODIN) 5-325 MG per tablet Taking 1.5 tablet in the am, 1 tablet at lunch and 1.5 tablet at bedtime    . lisinopril (PRINIVIL,ZESTRIL) 40 MG  tablet Take 40 mg by mouth daily.    Marland Kitchen loratadine (CLARITIN) 10 MG tablet Take 10 mg by mouth daily.    . Multiple Vitamin (ONE-A-DAY MENS PO) Take by mouth daily. Centrum silver    . Multiple Vitamins-Minerals (ZINC PO) Taking 1 tablet every other day     . polyvinyl alcohol (LIQUIFILM TEARS) 1.4 % ophthalmic solution Place 1 drop into both eyes 3 (three) times daily as needed for dry eyes.    Marland Kitchen sertraline (ZOLOFT) 100 MG tablet Take 100 mg by mouth daily.    . vitamin B-12 (CYANOCOBALAMIN) 1000 MCG tablet Take 1,000 mcg by mouth every other day.     . vitamin C (ASCORBIC ACID) 500 MG tablet Take 500 mg by mouth every other day.     Marland Kitchen VITAMIN D, CHOLECALCIFEROL, PO Take by mouth every other day.     . vitamin E (VITAMIN E) 400 UNIT capsule Take 400 Units by mouth every other day.      No current facility-administered medications for this encounter.    Allergies  Allergen Reactions  . Citalopram Hydrobromide     Pt did not like the way the pill made him feel " just didn't care about anything"  . Codeine Itching    PT denies Codeine allergy  . Dilaudid [Hydromorphone Hcl]   . Tramadol   . Vicodin [Hydrocodone-Acetaminophen]     Social History   Socioeconomic History  . Marital status: Widowed    Spouse name: Not on file  . Number of children: Not on file  . Years of education: Not on file  . Highest education level: Not on file  Occupational History  . Not on file  Tobacco Use  . Smoking status: Former Smoker    Packs/day: 0.50    Years: 50.00    Pack years: 25.00    Types: Cigarettes  . Smokeless tobacco: Never Used  Substance and Sexual Activity  . Alcohol use: Yes    Alcohol/week: 2.0 - 3.0 standard drinks    Types: 2 - 3 Glasses of wine per week  . Drug use: No    Comment: quit marijuana in 2014  . Sexual activity: Not on file  Other Topics Concern  . Not on file  Social History Narrative  . Not on file   Social Determinants of Health   Financial Resource  Strain:   . Difficulty of Paying Living Expenses: Not on file  Food Insecurity:   . Worried About Charity fundraiser in the Last Year: Not on file  . Ran Out of Food in the Last Year: Not on file  Transportation Needs:   . Lack of Transportation (Medical): Not on file  . Lack of Transportation (Non-Medical): Not on file  Physical Activity:   . Days of Exercise per Week: Not on file  . Minutes of Exercise per Session: Not on file  Stress:   . Feeling of Stress : Not on file  Social Connections:   . Frequency  of Communication with Friends and Family: Not on file  . Frequency of Social Gatherings with Friends and Family: Not on file  . Attends Religious Services: Not on file  . Active Member of Clubs or Organizations: Not on file  . Attends Archivist Meetings: Not on file  . Marital Status: Not on file  Intimate Partner Violence:   . Fear of Current or Ex-Partner: Not on file  . Emotionally Abused: Not on file  . Physically Abused: Not on file  . Sexually Abused: Not on file     ROS- All systems are reviewed and negative except as per the HPI above.  Physical Exam: Vitals:   06/22/19 1431  BP: (!) 146/60  Pulse: 68  Weight: 83.9 kg  Height: 5\' 11"  (1.803 m)    GEN- The patient is well appearing elderly male, alert and oriented x 3 today.   HEENT-head normocephalic, atraumatic, sclera clear, conjunctiva pink, hearing intact, trachea midline. Lungs- Clear to ausculation bilaterally, normal work of breathing Heart- Regular rate and rhythm, no murmurs, rubs or gallops  GI- soft, NT, ND, + BS Extremities- no clubbing, cyanosis, or edema MS- no significant deformity or atrophy Skin- no rash or lesion Psych- euthymic mood, full affect Neuro- strength and sensation are intact   Wt Readings from Last 3 Encounters:  06/22/19 83.9 kg  03/23/19 85.5 kg  02/17/19 87.6 kg    EKG today demonstrates SR HR 68, 1st degree AV block, PR 272, QRS 90, QTc 444  Echo  02/02/19 demonstrated  1. Left ventricular ejection fraction, by visual estimation, is 55 to 60%. The left ventricle has normal function. Normal left ventricular size. There is moderately increased left ventricular hypertrophy. Normal wall motion.  2. Left ventricular diastolic Doppler parameters are consistent with pseudonormalization pattern of LV diastolic filling.  3. Moderate calcification of the mitral valve leaflet(s).  4. Moderate mitral annular calcification.  5. The mitral valve is degenerative. Trivial mitral valve regurgitation. No evidence of mitral stenosis. Mean gradient 3.5 mmHg across mitral valve.  6. The aortic valve is tricuspid. Aortic valve regurgitation is mild by color flow Doppler. Moderate aortic valve stenosis. Mean gradient 26 mmHg with AVA 1.25 cm^2  7. Left atrial size was moderately dilated.  8. Global right ventricle has normal systolic function.The right ventricular size is normal. No increase in right ventricular wall thickness.  9. Right atrial size was normal. 10. The tricuspid valve is normal in structure. Tricuspid valve regurgitation is trivial. 11. The inferior vena cava is normal in size with greater than 50% respiratory variability, suggesting right atrial pressure of 3 mmHg. 12. TR signal is inadequate for assessing pulmonary artery systolic pressure.  Epic records are reviewed at length today  Assessment and Plan:  1. Paroxysmal atrial fibrillation/atrial flutter Patient appears to be maintaining SR.  Continue Eliquis 5 mg BID. Continue diltiazem 180 mg daily.  This patients CHA2DS2-VASc Score and unadjusted Ischemic Stroke Rate (% per year) is equal to 4.8 % stroke rate/year from a score of 4  Above score calculated as 1 point each if present [CHF, HTN, DM, Vascular=MI/PAD/Aortic Plaque, Age if 65-74, or Male] Above score calculated as 2 points each if present [Age > 75, or Stroke/TIA/TE]  2. HTN Stable, no changes today.  3. Aortic  stenosis Moderate by last echo. Plan to repeat in one year. Followed by Dr Radford Pax.   Follow up in the AF clinic in 4 months.   [place]  [facility] [address] [place], [place] [address] [phone] 06/22/2019 2:49 PM

## 2019-07-06 ENCOUNTER — Ambulatory Visit: Payer: Medicare Other | Attending: Internal Medicine

## 2019-07-06 DIAGNOSIS — Z23 Encounter for immunization: Secondary | ICD-10-CM

## 2019-07-06 NOTE — Progress Notes (Signed)
   Covid-19 Vaccination Clinic  Name:  Jacob Christian    MRN: [id number] DOB: Sep 03, 1940  07/06/2019  Jacob Christian was observed post Covid-19 immunization for 15 minutes without incident. He was provided with Vaccine Information Sheet and instruction to access the V-Safe system.   Jacob Christian was instructed to call 911 with any severe reactions post vaccine: Marland Kitchen Difficulty breathing  . Swelling of face and throat  . A fast heartbeat  . A bad rash all over body  . Dizziness and weakness   Immunizations Administered    Name Date Dose VIS Date Route   Pfizer COVID-19 Vaccine 07/06/2019  9:55 AM 0.3 mL 03/25/2019 Intramuscular   Manufacturer: [place]   Lot: [id number]   [place]: [id number]

## 2019-07-22 ENCOUNTER — Ambulatory Visit (INDEPENDENT_AMBULATORY_CARE_PROVIDER_SITE_OTHER): Payer: Medicare Other | Admitting: Otolaryngology

## 2019-07-22 ENCOUNTER — Other Ambulatory Visit: Payer: Self-pay

## 2019-07-22 DIAGNOSIS — H6123 Impacted cerumen, bilateral: Secondary | ICD-10-CM | POA: Diagnosis not present

## 2019-07-22 DIAGNOSIS — H90A22 Sensorineural hearing loss, unilateral, left ear, with restricted hearing on the contralateral side: Secondary | ICD-10-CM

## 2019-07-22 NOTE — Progress Notes (Signed)
HPI: Jacob Christian is a 79 y.o. male who returns today for evaluation of left ear.  Patient apparently developed left ear sudden SNHL last July and was treated by Dr. Erik Obey with intratympanic steroid injections.  Post procedure he had a tympanic perforation.  He also has problems with wax buildup in his ears and has been irrigating his ears with Debrox.  He recently used Debrox in the left ear and has some discomfort and thought he might still have a hole in the left TM.  He also has nasal sinus issues for which he uses Flonase, azelastine and saline nasal sprays..  Past Medical History:  Diagnosis Date  . Anxiety   . Aortic stenosis   . Arthritis   . COPD (chronic obstructive pulmonary disease) ([place])    "borderline"  . Dermatitis   . GERD (gastroesophageal reflux disease)   . Hearing loss   . Heart murmur    "leaky valve" being monitored  . Hypertension   . Leg lesion   . Peripheral vascular disease ([place])   . Retroperitoneal mass LUQ s/p lap-assisted resection & left adrenalectomy 05/12/2014 05/12/2014  . Sciatica    "occasional"-right side   Past Surgical History:  Procedure Laterality Date  . LAPAROSCOPIC ADRENALECTOMY Left 05/12/2014   Procedure: LAPAROSCOPIC LEFT ADRENALECTOMY;  Surgeon: Michael Boston, MD;  Location: [facility];  Service: General;  Laterality: Left;  . Leg fracture repair Right 1996   metal plate in place, with bone graft   Social History   Socioeconomic History  . Marital status: Divorced    Spouse name: Not on file  . Number of children: Not on file  . Years of education: Not on file  . Highest education level: Not on file  Occupational History  . Not on file  Tobacco Use  . Smoking status: Former Smoker    Packs/day: 0.50    Years: 50.00    Pack years: 25.00    Types: Cigarettes  . Smokeless tobacco: Never Used  Substance and Sexual Activity  . Alcohol use: Yes    Alcohol/week: 2.0 - 3.0 standard drinks    Types: 2 - 3 Glasses of wine per week   . Drug use: No    Comment: quit marijuana in 2014  . Sexual activity: Not on file  Other Topics Concern  . Not on file  Social History Narrative  . Not on file   Social Determinants of Health   Financial Resource Strain:   . Difficulty of Paying Living Expenses:   Food Insecurity:   . Worried About Charity fundraiser in the Last Year:   . Arboriculturist in the Last Year:   Transportation Needs:   . Film/video editor (Medical):   Marland Kitchen Lack of Transportation (Non-Medical):   Physical Activity:   . Days of Exercise per Week:   . Minutes of Exercise per Session:   Stress:   . Feeling of Stress :   Social Connections:   . Frequency of Communication with Friends and Family:   . Frequency of Social Gatherings with Friends and Family:   . Attends Religious Services:   . Active Member of Clubs or Organizations:   . Attends Archivist Meetings:   Marland Kitchen Marital Status:    Family History  Problem Relation Age of Onset  . Throat cancer Mother   . CVA Father   . Hypertension Father   . CVA Brother   . Pancreatic cancer Maternal Uncle   .  Throat cancer Maternal Grandmother   . Colon cancer Brother    Allergies  Allergen Reactions  . Citalopram Hydrobromide     Pt did not like the way the pill made him feel " just didn't care about anything"  . Codeine Itching    PT denies Codeine allergy  . Dilaudid [Hydromorphone Hcl]   . Tramadol   . Vicodin [Hydrocodone-Acetaminophen]    Prior to Admission medications   Medication Sig Start Date End Date Taking? Authorizing Provider  Azelastine HCl 137 MCG/SPRAY SOLN USE 1 SRAY IN EACH NOSTRIL TWICE A DAY AS NEEDED 12/06/18   [provider]  clonazePAM (KLONOPIN) 1 MG tablet Take 1 mg by mouth 3 (three) times daily as needed. 06/09/19   [provider]  diltiazem (CARDIZEM CD) 180 MG 24 hr capsule Take 1 capsule (180 mg total) by mouth daily. 02/17/19 02/17/20  Fenton, Clint R, PA  ELIQUIS 5 MG TABS tablet TAKE 1  TABLET BY MOUTH TWICE A DAY 06/06/19   Fenton, Clint R, PA  HYDROcodone-acetaminophen (NORCO/VICODIN) 5-325 MG per tablet Taking 1.5 tablet in the am, 1 tablet at lunch and 1.5 tablet at bedtime 02/03/14   [provider]  lisinopril (PRINIVIL,ZESTRIL) 40 MG tablet Take 40 mg by mouth daily.    [provider]  loratadine (CLARITIN) 10 MG tablet Take 10 mg by mouth daily.    [provider]  Multiple Vitamin (ONE-A-DAY MENS PO) Take by mouth daily. Centrum silver    [provider]  Multiple Vitamins-Minerals (ZINC PO) Taking 1 tablet every other day     [provider]  polyvinyl alcohol (LIQUIFILM TEARS) 1.4 % ophthalmic solution Place 1 drop into both eyes 3 (three) times daily as needed for dry eyes.    [provider]  sertraline (ZOLOFT) 100 MG tablet Take 100 mg by mouth daily.    [provider]  vitamin B-12 (CYANOCOBALAMIN) 1000 MCG tablet Take 1,000 mcg by mouth every other day.     [provider]  vitamin C (ASCORBIC ACID) 500 MG tablet Take 500 mg by mouth every other day.     [provider]  VITAMIN D, CHOLECALCIFEROL, PO Take by mouth every other day.     [provider]  vitamin E (VITAMIN E) 400 UNIT capsule Take 400 Units by mouth every other day.     [provider]     Positive ROS: Otherwise negative  All other systems have been reviewed and were otherwise negative with the exception of those mentioned in the HPI and as above.  Physical Exam: Constitutional: Alert, well-appearing, no acute distress Ears: External ears without lesions or tenderness.  He had wax buildup in both ear canals that was cleaned with a curette.  The left TM is intact with good mobility on pneumatic otoscopy with no tympanic perforation. Nasal: External nose without lesions. Septum relatively midline with mild rhinitis..  Middle meatus regions are clear with no signs of infection. Oral: Lips and gums  without lesions. Tongue and palate mucosa without lesions. Posterior oropharynx clear. Neck: No palpable adenopathy or masses Respiratory: Breathing comfortably  Skin: No facial/neck lesions or rash noted.  Cerumen impaction removal  Date/Time: 07/22/2019 5:59 PM Performed by: Rozetta Nunnery, MD Authorized by: Rozetta Nunnery, MD   Consent:    Consent obtained:  Verbal   Consent given by:  Patient   Risks discussed:  Pain and bleeding Procedure details:    Location:  L ear  and R ear   Procedure type: curette   Post-procedure details:    Inspection:  TM intact and canal normal   Hearing quality:  Improved   Patient tolerance of procedure:  Tolerated well, no immediate complications Comments:     Tympanic membranes are clear bilaterally.    Assessment: Wax buildup.  No tympanic membrane perforation.  No signs of infection or ear canal problems. History of left ear sudden sensorineural hearing loss  Plan: Reviewed with patient that there is no evidence of perforation in the ear.  Debrox should be okay to use in the future.  If he has problems with wax buildup in his ears he will follow up here as needed. Concerning his hearing loss his only option would be hearing aids and reviewed this with him.   Radene Journey, MD

## 2019-08-27 ENCOUNTER — Telehealth: Payer: Self-pay | Admitting: Physician Assistant

## 2019-08-27 NOTE — Telephone Encounter (Signed)
Patient paged after our answering service complaining of elevated blood pressure and heart rate.  Blood pressure was 192/121.  Heart rate was 131.  After resting a little longer, heart rate dropped down to 120s.  Blood pressure remained in the [id number] systolic.  He just took his diltiazem about 30 minutes ago along with lisinopril.  I recommend a recheck blood pressure and a heart rate in about 30 minutes, if heart rate is still elevated and the blood pressure is stable, I would recommend him to take another dose of 180 mg diltiazem capsule.  I am concerned that he might be back in atrial fibrillation and asked him to contact [facility]. fib clinic on Monday morning to see if he can be seen earlier.

## 2019-08-29 NOTE — Telephone Encounter (Signed)
Pt feeling improved today - HR in the 80s. Just woke up from a nap. Said the extra capsule of cardizem converted him after about a hour. Pt will notify if increased burden of afib and we will move appt up from July at that point. Pt has appt with his PCP next week for physical.

## 2019-08-29 NOTE — Telephone Encounter (Signed)
Left message to check on patient.

## 2019-10-07 ENCOUNTER — Other Ambulatory Visit (HOSPITAL_COMMUNITY): Payer: Self-pay | Admitting: *Deleted

## 2019-10-07 MED ORDER — APIXABAN 5 MG PO TABS: 5.0000 mg | ORAL_TABLET | Freq: Two times a day (BID) | ORAL | 6 refills | Status: DC

## 2019-10-25 ENCOUNTER — Ambulatory Visit (HOSPITAL_COMMUNITY)
Admission: RE | Admit: 2019-10-25 | Discharge: 2019-10-25 | Disposition: A | Discharge status: Home / Self Care | Payer: Medicare Other | Source: Ambulatory Visit | Attending: Physician Assistant | Admitting: Physician Assistant

## 2019-10-25 ENCOUNTER — Other Ambulatory Visit: Payer: Self-pay

## 2019-10-25 ENCOUNTER — Encounter (HOSPITAL_COMMUNITY): Payer: Self-pay | Admitting: Physician Assistant

## 2019-10-25 VITALS — BP 124/74 | HR 82 | Ht 71.0 in | Wt 180.0 lb

## 2019-10-25 DIAGNOSIS — D6869 Other thrombophilia: Secondary | ICD-10-CM | POA: Diagnosis not present

## 2019-10-25 DIAGNOSIS — Z87891 Personal history of nicotine dependence: Secondary | ICD-10-CM | POA: Diagnosis not present

## 2019-10-25 DIAGNOSIS — Z8249 Family history of ischemic heart disease and other diseases of the circulatory system: Secondary | ICD-10-CM | POA: Insufficient documentation

## 2019-10-25 DIAGNOSIS — I10 Essential (primary) hypertension: Secondary | ICD-10-CM | POA: Insufficient documentation

## 2019-10-25 DIAGNOSIS — Z7901 Long term (current) use of anticoagulants: Secondary | ICD-10-CM | POA: Diagnosis not present

## 2019-10-25 DIAGNOSIS — F419 Anxiety disorder, unspecified: Secondary | ICD-10-CM | POA: Diagnosis not present

## 2019-10-25 DIAGNOSIS — I484 Atypical atrial flutter: Secondary | ICD-10-CM | POA: Diagnosis not present

## 2019-10-25 DIAGNOSIS — Z79899 Other long term (current) drug therapy: Secondary | ICD-10-CM | POA: Insufficient documentation

## 2019-10-25 DIAGNOSIS — I480 Paroxysmal atrial fibrillation: Secondary | ICD-10-CM | POA: Insufficient documentation

## 2019-10-25 DIAGNOSIS — R011 Cardiac murmur, unspecified: Secondary | ICD-10-CM | POA: Insufficient documentation

## 2019-10-25 DIAGNOSIS — J449 Chronic obstructive pulmonary disease, unspecified: Secondary | ICD-10-CM | POA: Diagnosis not present

## 2019-10-25 DIAGNOSIS — I4892 Unspecified atrial flutter: Secondary | ICD-10-CM | POA: Insufficient documentation

## 2019-10-25 DIAGNOSIS — I350 Nonrheumatic aortic (valve) stenosis: Secondary | ICD-10-CM | POA: Diagnosis not present

## 2019-10-25 NOTE — Progress Notes (Signed)
Primary Care Physician: Alroy Dust, L.Marlou Sa, MD Primary Cardiologist: Dr Radford Pax Primary Electrophysiologist: none Referring Physician: Dr Alden Server is a 79 y.o. male with a history of new onset paroxysmal atrial fibrillation, atrial flutter, PVD, HTN, and COPD who presents for follow up in the [facility].  The patient was initially diagnosed with atrial fibrillation 02/17/19 after it was noted on his event monitor. Patient was noted to have an irregular heart beat on exam at his ENT appointment which prompted a referral to cardiology. The event monitor showed both atrial flutter and atrial fibrillation. He has had some fatigue with exertion but denies palpitations or heart racing. He denies snoring. He has a CHADS2VASC score of 4. Patient had 11% afib burden on his event monitor. He did have rapid rates at times but the median rate was controlled. Patient does report that he was receiving steroid injections for an ear condition just prior to the onset of his symptoms. His symptoms improved on diltiazem.  On follow up today, patient reports that he has done reasonably well since his last visit. He did have an episode of heart racing mid May and was instructed to take an extra diltiazem which resolved his symptoms. He does report that he has had fatigue for 2-3 weeks and when he went to his PCP for his annual physical his Na was low. His fatigue greatly improved with Na replacement. He bought a new BP monitor which he checks daily and he reports it has shown an irregular heart rhythm ~80% of the time.   Today, he denies symptoms of palpitations, chest pain, shortness of breath, orthopnea, PND, lower extremity edema, presyncope, syncope, snoring, daytime somnolence, bleeding, or neurologic sequela. The patient is tolerating medications without difficulties and is otherwise without complaint today.    Atrial Fibrillation Risk Factors:  he does not have  symptoms or diagnosis of sleep apnea. he does not have a history of rheumatic fever. he does not have a history of alcohol use. The patient does not have a history of early familial atrial fibrillation or other arrhythmias.  he has a BMI of Body mass index is 25.1 kg/m.Marland Kitchen Filed Weights   10/25/19 1436  Weight: 81.6 kg    Family History  Problem Relation Age of Onset  . Throat cancer Mother   . CVA Father   . Hypertension Father   . CVA Brother   . Pancreatic cancer Maternal Uncle   . Throat cancer Maternal Grandmother   . Colon cancer Brother      Atrial Fibrillation Management history:  Previous antiarrhythmic drugs: none Previous cardioversions: none Previous ablations: none CHADS2VASC score: 4 Anticoagulation history: Eliquis   Past Medical History:  Diagnosis Date  . Anxiety   . Aortic stenosis   . Arthritis   . COPD (chronic obstructive pulmonary disease) ([place])    "borderline"  . Dermatitis   . GERD (gastroesophageal reflux disease)   . Hearing loss   . Heart murmur    "leaky valve" being monitored  . Hypertension   . Leg lesion   . Peripheral vascular disease ([place])   . Retroperitoneal mass LUQ s/p lap-assisted resection & left adrenalectomy 05/12/2014 05/12/2014  . Sciatica    "occasional"-right side   Past Surgical History:  Procedure Laterality Date  . LAPAROSCOPIC ADRENALECTOMY Left 05/12/2014   Procedure: LAPAROSCOPIC LEFT ADRENALECTOMY;  Surgeon: Michael Boston, MD;  Location: [facility];  Service: General;  Laterality: Left;  . Leg fracture  repair Right 1996   metal plate in place, with bone graft    Current Outpatient Medications  Medication Sig Dispense Refill  . ALPRAZolam (XANAX) 1 MG tablet Take 1 mg by mouth 3 (three) times daily as needed.    Marland Kitchen apixaban (ELIQUIS) 5 MG TABS tablet Take 1 tablet (5 mg total) by mouth 2 (two) times daily. 60 tablet 6  . Azelastine HCl 137 MCG/SPRAY SOLN USE 1 SRAY IN EACH NOSTRIL TWICE A DAY AS NEEDED    .  diltiazem (CARDIZEM CD) 180 MG 24 hr capsule Take 1 capsule (180 mg total) by mouth daily. 30 capsule 11  . fluticasone (FLONASE) 50 MCG/ACT nasal spray Place 1 spray into both nostrils daily.    Marland Kitchen HYDROcodone-acetaminophen (NORCO/VICODIN) 5-325 MG per tablet Taking 1.5 tablet in the am, 1 tablet at lunch and 1.5 tablet at bedtime    . lisinopril (PRINIVIL,ZESTRIL) 40 MG tablet Take 40 mg by mouth daily.    Marland Kitchen loratadine (CLARITIN) 10 MG tablet Take 10 mg by mouth daily.    . Multiple Vitamin (ONE-A-DAY MENS PO) Take by mouth daily. Centrum silver    . Multiple Vitamins-Minerals (ZINC PO) Taking 1 tablet every other day     . polyvinyl alcohol (LIQUIFILM TEARS) 1.4 % ophthalmic solution Place 1 drop into both eyes 3 (three) times daily as needed for dry eyes.    Marland Kitchen sertraline (ZOLOFT) 100 MG tablet Take 100 mg by mouth daily.    Marland Kitchen triamcinolone cream (KENALOG) 0.5 % APPLY SPARINGLY TO AFFECTED AREA TWICE A DAY AS NEEDED    . vitamin B-12 (CYANOCOBALAMIN) 1000 MCG tablet Take 1,000 mcg by mouth every other day.     . vitamin C (ASCORBIC ACID) 500 MG tablet Take 500 mg by mouth every other day.     Marland Kitchen VITAMIN D, CHOLECALCIFEROL, PO Take by mouth every other day.     . vitamin E (VITAMIN E) 400 UNIT capsule Take 400 Units by mouth every other day.      No current facility-administered medications for this encounter.    Allergies  Allergen Reactions  . Citalopram Hydrobromide     Pt did not like the way the pill made him feel " just didn't care about anything"  . Codeine Itching    PT denies Codeine allergy  . Dilaudid [Hydromorphone Hcl]   . Tramadol   . Vicodin [Hydrocodone-Acetaminophen]     Social History   Socioeconomic History  . Marital status: Divorced    Spouse name: Not on file  . Number of children: Not on file  . Years of education: Not on file  . Highest education level: Not on file  Occupational History  . Not on file  Tobacco Use  . Smoking status: Former Smoker     Packs/day: 0.50    Years: 50.00    Pack years: 25.00    Types: Cigarettes  . Smokeless tobacco: Never Used  Substance and Sexual Activity  . Alcohol use: Yes    Alcohol/week: 2.0 - 3.0 standard drinks    Types: 2 - 3 Glasses of wine per week  . Drug use: Yes    Types: Marijuana    Comment: once daily  . Sexual activity: Not on file  Other Topics Concern  . Not on file  Social History Narrative  . Not on file   Social Determinants of Health   Financial Resource Strain:   . Difficulty of Paying Living Expenses:   Food Insecurity:   .  Worried About Charity fundraiser in the Last Year:   . Arboriculturist in the Last Year:   Transportation Needs:   . Film/video editor (Medical):   Marland Kitchen Lack of Transportation (Non-Medical):   Physical Activity:   . Days of Exercise per Week:   . Minutes of Exercise per Session:   Stress:   . Feeling of Stress :   Social Connections:   . Frequency of Communication with Friends and Family:   . Frequency of Social Gatherings with Friends and Family:   . Attends Religious Services:   . Active Member of Clubs or Organizations:   . Attends Archivist Meetings:   Marland Kitchen Marital Status:   Intimate Partner Violence:   . Fear of Current or Ex-Partner:   . Emotionally Abused:   Marland Kitchen Physically Abused:   . Sexually Abused:      ROS- All systems are reviewed and negative except as per the HPI above.  Physical Exam: Vitals:   10/25/19 1436  BP: 124/74  Pulse: 82  Weight: 81.6 kg  Height: 5\' 11"  (1.803 m)    GEN- The patient is well appearing elderly male, alert and oriented x 3 today.   HEENT-head normocephalic, atraumatic, sclera clear, conjunctiva pink, hearing intact, trachea midline. Lungs- mild wheezing, normal work of breathing Heart- irregular rate and rhythm, no murmurs, rubs or gallops  GI- soft, NT, ND, + BS Extremities- no clubbing, cyanosis, or edema MS- no significant deformity or atrophy Skin- no rash or  lesion Psych- euthymic mood, full affect Neuro- strength and sensation are intact   Wt Readings from Last 3 Encounters:  10/25/19 81.6 kg  06/22/19 83.9 kg  03/23/19 85.5 kg    EKG today demonstrates atypical atrial flutter with variable conduction HR 82, QRS 94, QTc 443  Echo 02/02/19 demonstrated  1. Left ventricular ejection fraction, by visual estimation, is 55 to 60%. The left ventricle has normal function. Normal left ventricular size. There is moderately increased left ventricular hypertrophy. Normal wall motion.  2. Left ventricular diastolic Doppler parameters are consistent with pseudonormalization pattern of LV diastolic filling.  3. Moderate calcification of the mitral valve leaflet(s).  4. Moderate mitral annular calcification.  5. The mitral valve is degenerative. Trivial mitral valve regurgitation. No evidence of mitral stenosis. Mean gradient 3.5 mmHg across mitral valve.  6. The aortic valve is tricuspid. Aortic valve regurgitation is mild by color flow Doppler. Moderate aortic valve stenosis. Mean gradient 26 mmHg with AVA 1.25 cm^2  7. Left atrial size was moderately dilated.  8. Global right ventricle has normal systolic function.The right ventricular size is normal. No increase in right ventricular wall thickness.  9. Right atrial size was normal. 10. The tricuspid valve is normal in structure. Tricuspid valve regurgitation is trivial. 11. The inferior vena cava is normal in size with greater than 50% respiratory variability, suggesting right atrial pressure of 3 mmHg. 12. TR signal is inadequate for assessing pulmonary artery systolic pressure.  Epic records are reviewed at length today  Assessment and Plan:  1. Paroxysmal atrial fibrillation/atrial flutter Patient in atrial flutter today. Asymptomatic.  We discussed therapeutic options today including AAD therapy vs rate control. Patient would like to try rate control for now as he feels well and is rate  controlled. We did discuss that the longer he remains out of rhythm, the more difficult it could be to restore SR in the future. Patient voices understanding.  Patient interested in [facility] for  home monitoring.  Continue Eliquis 5 mg BID. Continue diltiazem 180 mg daily.  This patients CHA2DS2-VASc Score and unadjusted Ischemic Stroke Rate (% per year) is equal to 4.8 % stroke rate/year from a score of 4  Above score calculated as 1 point each if present [CHF, HTN, DM, Vascular=MI/PAD/Aortic Plaque, Age if 65-74, or Male] Above score calculated as 2 points each if present [Age > 75, or Stroke/TIA/TE]  2. HTN Stable, no changes today.  3. Aortic stenosis Moderate by last echo. Followed by Dr Radford Pax.   Follow up with AF clinic in 2 months and Dr Radford Pax per recall.    [facility] [address] [place], [place] [address] [phone] 10/25/2019 3:29 PM

## 2019-12-23 ENCOUNTER — Ambulatory Visit (INDEPENDENT_AMBULATORY_CARE_PROVIDER_SITE_OTHER): Payer: Medicare Other | Admitting: Otolaryngology

## 2019-12-23 ENCOUNTER — Encounter (INDEPENDENT_AMBULATORY_CARE_PROVIDER_SITE_OTHER): Payer: Self-pay | Admitting: Otolaryngology

## 2019-12-23 ENCOUNTER — Other Ambulatory Visit: Payer: Self-pay

## 2019-12-23 VITALS — Temp 97.3°F

## 2019-12-23 DIAGNOSIS — H6123 Impacted cerumen, bilateral: Secondary | ICD-10-CM | POA: Diagnosis not present

## 2019-12-23 DIAGNOSIS — H903 Sensorineural hearing loss, bilateral: Secondary | ICD-10-CM | POA: Diagnosis not present

## 2019-12-23 NOTE — Progress Notes (Signed)
HPI: Jacob Christian is a 79 y.o. male who presents for evaluation of wax buildup in his ears.  He is used Debrox as well as ear wash systems.  He is doing this often on a weekly basis.Marland Kitchen  He has very poor hearing in both ears but only wears a hearing aid in the right ear.  His audiologist did not feel like a hearing aid would benefit the left side.  Past Medical History:  Diagnosis Date  . Anxiety   . Aortic stenosis   . Arthritis   . COPD (chronic obstructive pulmonary disease) ([place])    "borderline"  . Dermatitis   . GERD (gastroesophageal reflux disease)   . Hearing loss   . Heart murmur    "leaky valve" being monitored  . Hypertension   . Leg lesion   . Peripheral vascular disease ([place])   . Retroperitoneal mass LUQ s/p lap-assisted resection & left adrenalectomy 05/12/2014 05/12/2014  . Sciatica    "occasional"-right side   Past Surgical History:  Procedure Laterality Date  . LAPAROSCOPIC ADRENALECTOMY Left 05/12/2014   Procedure: LAPAROSCOPIC LEFT ADRENALECTOMY;  Surgeon: Michael Boston, MD;  Location: [facility];  Service: General;  Laterality: Left;  . Leg fracture repair Right 1996   metal plate in place, with bone graft   Social History   Socioeconomic History  . Marital status: Divorced    Spouse name: Not on file  . Number of children: Not on file  . Years of education: Not on file  . Highest education level: Not on file  Occupational History  . Not on file  Tobacco Use  . Smoking status: Former Smoker    Packs/day: 0.50    Years: 50.00    Pack years: 25.00    Types: Cigarettes  . Smokeless tobacco: Never Used  Substance and Sexual Activity  . Alcohol use: Yes    Alcohol/week: 2.0 - 3.0 standard drinks    Types: 2 - 3 Glasses of wine per week  . Drug use: Yes    Types: Marijuana    Comment: once daily  . Sexual activity: Not on file  Other Topics Concern  . Not on file  Social History Narrative  . Not on file   Social Determinants of Health    Financial Resource Strain:   . Difficulty of Paying Living Expenses: Not on file  Food Insecurity:   . Worried About Charity fundraiser in the Last Year: Not on file  . Ran Out of Food in the Last Year: Not on file  Transportation Needs:   . Lack of Transportation (Medical): Not on file  . Lack of Transportation (Non-Medical): Not on file  Physical Activity:   . Days of Exercise per Week: Not on file  . Minutes of Exercise per Session: Not on file  Stress:   . Feeling of Stress : Not on file  Social Connections:   . Frequency of Communication with Friends and Family: Not on file  . Frequency of Social Gatherings with Friends and Family: Not on file  . Attends Religious Services: Not on file  . Active Member of Clubs or Organizations: Not on file  . Attends Archivist Meetings: Not on file  . Marital Status: Not on file   Family History  Problem Relation Age of Onset  . Throat cancer Mother   . CVA Father   . Hypertension Father   . CVA Brother   . Pancreatic cancer Maternal Uncle   .  Throat cancer Maternal Grandmother   . Colon cancer Brother    Allergies  Allergen Reactions  . Citalopram Hydrobromide     Pt did not like the way the pill made him feel " just didn't care about anything"  . Codeine Itching    PT denies Codeine allergy  . Dilaudid [Hydromorphone Hcl]   . Tramadol   . Vicodin [Hydrocodone-Acetaminophen]    Prior to Admission medications   Medication Sig Start Date End Date Taking? Authorizing Provider  ALPRAZolam Duanne Moron) 1 MG tablet Take 1 mg by mouth 3 (three) times daily as needed. 10/20/19  Yes [provider]  apixaban (ELIQUIS) 5 MG TABS tablet Take 1 tablet (5 mg total) by mouth 2 (two) times daily. 10/07/19  Yes Fenton, Clint R, PA  Azelastine HCl 137 MCG/SPRAY SOLN USE 1 SRAY IN EACH NOSTRIL TWICE A DAY AS NEEDED 12/06/18  Yes [provider]  diltiazem (CARDIZEM CD) 180 MG 24 hr capsule Take 1 capsule (180 mg total) by  mouth daily. 02/17/19 02/17/20 Yes Fenton, Clint R, PA  fluticasone (FLONASE) 50 MCG/ACT nasal spray Place 1 spray into both nostrils daily. 10/20/19  Yes [provider]  HYDROcodone-acetaminophen (NORCO/VICODIN) 5-325 MG per tablet Taking 1.5 tablet in the am, 1 tablet at lunch and 1.5 tablet at bedtime 02/03/14  Yes [provider]  lisinopril (PRINIVIL,ZESTRIL) 40 MG tablet Take 40 mg by mouth daily.   Yes [provider]  loratadine (CLARITIN) 10 MG tablet Take 10 mg by mouth daily.   Yes [provider]  Multiple Vitamin (ONE-A-DAY MENS PO) Take by mouth daily. Centrum silver   Yes [provider]  Multiple Vitamins-Minerals (ZINC PO) Taking 1 tablet every other day    Yes [provider]  polyvinyl alcohol (LIQUIFILM TEARS) 1.4 % ophthalmic solution Place 1 drop into both eyes 3 (three) times daily as needed for dry eyes.   Yes [provider]  sertraline (ZOLOFT) 100 MG tablet Take 100 mg by mouth daily.   Yes [provider]  triamcinolone cream (KENALOG) 0.5 % APPLY SPARINGLY TO AFFECTED AREA TWICE A DAY AS NEEDED 07/27/19  Yes [provider]  vitamin B-12 (CYANOCOBALAMIN) 1000 MCG tablet Take 1,000 mcg by mouth every other day.    Yes [provider]  vitamin C (ASCORBIC ACID) 500 MG tablet Take 500 mg by mouth every other day.    Yes [provider]  VITAMIN D, CHOLECALCIFEROL, PO Take by mouth every other day.    Yes [provider]  vitamin E (VITAMIN E) 400 UNIT capsule Take 400 Units by mouth every other day.    Yes [provider]     Positive ROS: Otherwise negative  All other systems have been reviewed and were otherwise negative with the exception of those mentioned in the HPI and as above.  Physical Exam: Constitutional: Alert, well-appearing, no acute distress Ears: External ears without lesions or tenderness. Ear canals with a large amount of wax in both ear  canals.  This was cleaned using suction and forceps.  He has slight desquamation of the skin of the right ear canal perhaps related to chronic moisture.  This was cleaned with suction.  TMs were clear bilaterally otherwise. Nasal: External nose without lesions. Clear nasal passages Oral: Oropharynx clear. Neck: No palpable adenopathy or masses Respiratory: Breathing comfortably  Skin: No facial/neck lesions or rash noted.  Cerumen impaction removal  Date/Time: 12/23/2019 5:14 PM Performed by: Rozetta Nunnery, MD  Authorized by: Rozetta Nunnery, MD   Consent:    Consent obtained:  Verbal   Consent given by:  Patient   Risks discussed:  Pain and bleeding Procedure details:    Location:  L ear and R ear   Procedure type: curette and suction   Post-procedure details:    Inspection:  TM intact and canal normal   Hearing quality:  Improved   Patient tolerance of procedure:  Tolerated well, no immediate complications Comments:     TMs are clear bilaterally    Assessment: Wax buildup bilaterally although he only wears a hearing aid in the right ear.  Plan: He will follow-up in 3 to 4 months for recheck.  I discussed not using the ear wash system any more than every 2 to 3 weeks.  As moisture will lead to further problems if chronically in the ear.  Radene Journey, MD

## 2019-12-27 ENCOUNTER — Other Ambulatory Visit: Payer: Self-pay

## 2019-12-27 ENCOUNTER — Encounter (HOSPITAL_COMMUNITY): Payer: Self-pay | Admitting: Physician Assistant

## 2019-12-27 ENCOUNTER — Ambulatory Visit (HOSPITAL_COMMUNITY)
Admission: RE | Admit: 2019-12-27 | Discharge: 2019-12-27 | Disposition: A | Discharge status: Home / Self Care | Payer: Medicare Other | Source: Ambulatory Visit | Attending: Physician Assistant | Admitting: Physician Assistant

## 2019-12-27 VITALS — BP 152/80 | HR 59 | Ht 71.0 in | Wt 183.0 lb

## 2019-12-27 DIAGNOSIS — F419 Anxiety disorder, unspecified: Secondary | ICD-10-CM | POA: Insufficient documentation

## 2019-12-27 DIAGNOSIS — D6869 Other thrombophilia: Secondary | ICD-10-CM

## 2019-12-27 DIAGNOSIS — Z79899 Other long term (current) drug therapy: Secondary | ICD-10-CM | POA: Diagnosis not present

## 2019-12-27 DIAGNOSIS — I4892 Unspecified atrial flutter: Secondary | ICD-10-CM | POA: Diagnosis not present

## 2019-12-27 DIAGNOSIS — Z87891 Personal history of nicotine dependence: Secondary | ICD-10-CM | POA: Insufficient documentation

## 2019-12-27 DIAGNOSIS — I350 Nonrheumatic aortic (valve) stenosis: Secondary | ICD-10-CM | POA: Diagnosis not present

## 2019-12-27 DIAGNOSIS — I10 Essential (primary) hypertension: Secondary | ICD-10-CM | POA: Insufficient documentation

## 2019-12-27 DIAGNOSIS — I480 Paroxysmal atrial fibrillation: Secondary | ICD-10-CM

## 2019-12-27 DIAGNOSIS — K219 Gastro-esophageal reflux disease without esophagitis: Secondary | ICD-10-CM | POA: Insufficient documentation

## 2019-12-27 DIAGNOSIS — J449 Chronic obstructive pulmonary disease, unspecified: Secondary | ICD-10-CM | POA: Insufficient documentation

## 2019-12-27 DIAGNOSIS — Z7901 Long term (current) use of anticoagulants: Secondary | ICD-10-CM | POA: Diagnosis not present

## 2019-12-27 NOTE — Progress Notes (Signed)
Primary Care Physician: Alroy Dust, L.Marlou Sa, MD Primary Cardiologist: Dr Radford Pax Primary Electrophysiologist: none Referring Physician: Dr Alden Server is a 79 y.o. male with a history of new onset paroxysmal atrial fibrillation, atrial flutter, PVD, HTN, and COPD who presents for follow up in the [facility].  The patient was initially diagnosed with atrial fibrillation 02/17/19 after it was noted on his event monitor. Patient was noted to have an irregular heart beat on exam at his ENT appointment which prompted a referral to cardiology. The event monitor showed both atrial flutter and atrial fibrillation. He has had some fatigue with exertion but denies palpitations or heart racing. He denies snoring. He has a CHADS2VASC score of 4. Patient had 11% afib burden on his event monitor. He did have rapid rates at times but the median rate was controlled. Patient does report that he was receiving steroid injections for an ear condition just prior to the onset of his symptoms. His symptoms improved on diltiazem.  On follow up today, patient reports he has had "good days and bad days" but recently has been feeling well. He is in rate controlled afib. He reports that he is frequently in and out of afib. He is unaware of his arrhythmia. His primary concerns today are his orthopedic knee issues.   Today, he denies symptoms of palpitations, chest pain, shortness of breath, orthopnea, PND, lower extremity edema, presyncope, syncope, snoring, daytime somnolence, bleeding, or neurologic sequela. The patient is tolerating medications without difficulties and is otherwise without complaint today.    Atrial Fibrillation Risk Factors:  he does not have symptoms or diagnosis of sleep apnea. he does not have a history of rheumatic fever. he does not have a history of alcohol use. The patient does not have a history of early familial atrial fibrillation or other  arrhythmias.  he has a BMI of Body mass index is 25.52 kg/m.Marland Kitchen Filed Weights   12/27/19 1405  Weight: 83 kg    Family History  Problem Relation Age of Onset  . Throat cancer Mother   . CVA Father   . Hypertension Father   . CVA Brother   . Pancreatic cancer Maternal Uncle   . Throat cancer Maternal Grandmother   . Colon cancer Brother      Atrial Fibrillation Management history:  Previous antiarrhythmic drugs: none Previous cardioversions: none Previous ablations: none CHADS2VASC score: 4 Anticoagulation history: Eliquis   Past Medical History:  Diagnosis Date  . Anxiety   . Aortic stenosis   . Arthritis   . COPD (chronic obstructive pulmonary disease) ([place])    "borderline"  . Dermatitis   . GERD (gastroesophageal reflux disease)   . Hearing loss   . Heart murmur    "leaky valve" being monitored  . Hypertension   . Leg lesion   . Peripheral vascular disease ([place])   . Retroperitoneal mass LUQ s/p lap-assisted resection & left adrenalectomy 05/12/2014 05/12/2014  . Sciatica    "occasional"-right side   Past Surgical History:  Procedure Laterality Date  . LAPAROSCOPIC ADRENALECTOMY Left 05/12/2014   Procedure: LAPAROSCOPIC LEFT ADRENALECTOMY;  Surgeon: Michael Boston, MD;  Location: [facility];  Service: General;  Laterality: Left;  . Leg fracture repair Right 1996   metal plate in place, with bone graft    Current Outpatient Medications  Medication Sig Dispense Refill  . ALPRAZolam (XANAX) 1 MG tablet Take 1 mg by mouth 3 (three) times daily as needed.    Marland Kitchen  apixaban (ELIQUIS) 5 MG TABS tablet Take 1 tablet (5 mg total) by mouth 2 (two) times daily. 60 tablet 6  . Azelastine HCl 137 MCG/SPRAY SOLN USE 1 SRAY IN EACH NOSTRIL TWICE A DAY AS NEEDED    . diltiazem (CARDIZEM CD) 180 MG 24 hr capsule Take 1 capsule (180 mg total) by mouth daily. 30 capsule 11  . fluticasone (FLONASE) 50 MCG/ACT nasal spray Place 1 spray into both nostrils daily.    Marland Kitchen  HYDROcodone-acetaminophen (NORCO/VICODIN) 5-325 MG per tablet Taking 1.5 tablet in the am, 1 tablet at lunch and 1.5 tablet at bedtime    . lisinopril (PRINIVIL,ZESTRIL) 40 MG tablet Take 40 mg by mouth daily.    Marland Kitchen loratadine (CLARITIN) 10 MG tablet Take 10 mg by mouth daily.    . montelukast (SINGULAIR) 10 MG tablet Take 10 mg by mouth daily.    . Multiple Vitamin (ONE-A-DAY MENS PO) Take by mouth daily. Centrum silver    . Multiple Vitamins-Minerals (ZINC PO) Taking 1 tablet every other day     . pantoprazole (PROTONIX) 40 MG tablet Take 40 mg by mouth daily.    . polyvinyl alcohol (LIQUIFILM TEARS) 1.4 % ophthalmic solution Place 1 drop into both eyes 3 (three) times daily as needed for dry eyes.    Marland Kitchen sertraline (ZOLOFT) 100 MG tablet Take 100 mg by mouth daily.    Marland Kitchen triamcinolone cream (KENALOG) 0.5 % APPLY SPARINGLY TO AFFECTED AREA TWICE A DAY AS NEEDED    . vitamin B-12 (CYANOCOBALAMIN) 1000 MCG tablet Take 1,000 mcg by mouth every other day.     . vitamin C (ASCORBIC ACID) 500 MG tablet Take 500 mg by mouth every other day.     Marland Kitchen VITAMIN D, CHOLECALCIFEROL, PO Take by mouth every other day.     . vitamin E (VITAMIN E) 400 UNIT capsule Take 400 Units by mouth every other day.      No current facility-administered medications for this encounter.    Allergies  Allergen Reactions  . Citalopram Hydrobromide     Pt did not like the way the pill made him feel " just didn't care about anything"  . Codeine Itching    PT denies Codeine allergy  . Dilaudid [Hydromorphone Hcl]   . Tramadol   . Vicodin [Hydrocodone-Acetaminophen]     Social History   Socioeconomic History  . Marital status: Divorced    Spouse name: Not on file  . Number of children: Not on file  . Years of education: Not on file  . Highest education level: Not on file  Occupational History  . Not on file  Tobacco Use  . Smoking status: Former Smoker    Packs/day: 0.50    Years: 50.00    Pack years: 25.00     Types: Cigarettes  . Smokeless tobacco: Never Used  Substance and Sexual Activity  . Alcohol use: Yes    Alcohol/week: 2.0 - 3.0 standard drinks    Types: 2 - 3 Glasses of wine per week  . Drug use: Yes    Types: Marijuana    Comment: once daily  . Sexual activity: Not on file  Other Topics Concern  . Not on file  Social History Narrative  . Not on file   Social Determinants of Health   Financial Resource Strain:   . Difficulty of Paying Living Expenses: Not on file  Food Insecurity:   . Worried About Charity fundraiser in the Last Year: Not  on file  . Ran Out of Food in the Last Year: Not on file  Transportation Needs:   . Lack of Transportation (Medical): Not on file  . Lack of Transportation (Non-Medical): Not on file  Physical Activity:   . Days of Exercise per Week: Not on file  . Minutes of Exercise per Session: Not on file  Stress:   . Feeling of Stress : Not on file  Social Connections:   . Frequency of Communication with Friends and Family: Not on file  . Frequency of Social Gatherings with Friends and Family: Not on file  . Attends Religious Services: Not on file  . Active Member of Clubs or Organizations: Not on file  . Attends Archivist Meetings: Not on file  . Marital Status: Not on file  Intimate Partner Violence:   . Fear of Current or Ex-Partner: Not on file  . Emotionally Abused: Not on file  . Physically Abused: Not on file  . Sexually Abused: Not on file     ROS- All systems are reviewed and negative except as per the HPI above.  Physical Exam: Vitals:   12/27/19 1405  BP: (!) 152/80  Pulse: (!) 59  Weight: 83 kg  Height: 5\' 11"  (1.803 m)    GEN- The patient is well appearing elderly male, alert and oriented x 3 today.   HEENT-head normocephalic, atraumatic, sclera clear, conjunctiva pink, hearing intact, trachea midline. Lungs- Clear to ausculation bilaterally, normal work of breathing Heart- irregular rate and rhythm, no  murmurs, rubs or gallops  GI- soft, NT, ND, + BS Extremities- no clubbing, cyanosis, or edema MS- no significant deformity or atrophy Skin- no rash or lesion Psych- euthymic mood, full affect Neuro- strength and sensation are intact   Wt Readings from Last 3 Encounters:  12/27/19 83 kg  10/25/19 81.6 kg  06/22/19 83.9 kg    EKG today demonstrates afib HR 59, QRS 98, QTc 419  Echo 02/02/19 demonstrated  1. Left ventricular ejection fraction, by visual estimation, is 55 to 60%. The left ventricle has normal function. Normal left ventricular size. There is moderately increased left ventricular hypertrophy. Normal wall motion.  2. Left ventricular diastolic Doppler parameters are consistent with pseudonormalization pattern of LV diastolic filling.  3. Moderate calcification of the mitral valve leaflet(s).  4. Moderate mitral annular calcification.  5. The mitral valve is degenerative. Trivial mitral valve regurgitation. No evidence of mitral stenosis. Mean gradient 3.5 mmHg across mitral valve.  6. The aortic valve is tricuspid. Aortic valve regurgitation is mild by color flow Doppler. Moderate aortic valve stenosis. Mean gradient 26 mmHg with AVA 1.25 cm^2  7. Left atrial size was moderately dilated.  8. Global right ventricle has normal systolic function.The right ventricular size is normal. No increase in right ventricular wall thickness.  9. Right atrial size was normal. 10. The tricuspid valve is normal in structure. Tricuspid valve regurgitation is trivial. 11. The inferior vena cava is normal in size with greater than 50% respiratory variability, suggesting right atrial pressure of 3 mmHg. 12. TR signal is inadequate for assessing pulmonary artery systolic pressure.  Epic records are reviewed at length today  Assessment and Plan:  1. Paroxysmal atrial fibrillation/atrial flutter Patient in rate controlled afib today. He reports he feels well. Recall discussion 10/25/19 that  patient prefers to manage conservatively with rate control.  Continue Eliquis 5 mg BID. Continue diltiazem 180 mg daily.  This patients CHA2DS2-VASc Score and unadjusted Ischemic Stroke Rate (%  per year) is equal to 4.8 % stroke rate/year from a score of 4  Above score calculated as 1 point each if present [CHF, HTN, DM, Vascular=MI/PAD/Aortic Plaque, Age if 65-74, or Male] Above score calculated as 2 points each if present [Age > 75, or Stroke/TIA/TE]  2. HTN Stable, no changes today.   3. Aortic stenosis Moderate by last echo. Followed by Dr Radford Pax   Follow up with Dr Radford Pax per recall. AF clinic in 6 months.    [facility] [address] [place], [place] [address] [phone] 12/27/2019 2:34 PM

## 2020-02-11 ENCOUNTER — Ambulatory Visit: Payer: Medicare Other | Attending: Internal Medicine

## 2020-02-11 DIAGNOSIS — Z23 Encounter for immunization: Secondary | ICD-10-CM

## 2020-02-11 NOTE — Progress Notes (Signed)
   Covid-19 Vaccination Clinic  Name:  [name]    MRN: [id number] DOB: 04-Apr-1941  02/11/2020  Jacob Christian was observed post Covid-19 immunization for 15 minutes without incident. He was provided with Vaccine Information Sheet and instruction to access the V-Safe system.   [name] was instructed to call 911 with any severe reactions post vaccine: Marland Kitchen Difficulty breathing  . Swelling of face and throat  . A fast heartbeat  . A bad rash all over body  . Dizziness and weakness

## 2020-02-16 ENCOUNTER — Other Ambulatory Visit (HOSPITAL_COMMUNITY): Payer: Self-pay | Admitting: Physician Assistant

## 2020-04-26 ENCOUNTER — Telehealth (INDEPENDENT_AMBULATORY_CARE_PROVIDER_SITE_OTHER): Payer: Self-pay

## 2020-04-26 NOTE — Telephone Encounter (Signed)
Patient called to confirm appointment on 05/01/20 at 1:15 pm.

## 2020-05-01 ENCOUNTER — Ambulatory Visit (INDEPENDENT_AMBULATORY_CARE_PROVIDER_SITE_OTHER): Payer: Medicare Other | Admitting: Otolaryngology

## 2020-05-02 ENCOUNTER — Ambulatory Visit (INDEPENDENT_AMBULATORY_CARE_PROVIDER_SITE_OTHER): Payer: Medicare Other | Admitting: Otolaryngology

## 2020-05-04 ENCOUNTER — Other Ambulatory Visit (HOSPITAL_COMMUNITY): Payer: Self-pay | Admitting: Physician Assistant

## 2020-05-18 ENCOUNTER — Ambulatory Visit (INDEPENDENT_AMBULATORY_CARE_PROVIDER_SITE_OTHER): Payer: Medicare Other | Admitting: Otolaryngology

## 2020-05-18 ENCOUNTER — Encounter (INDEPENDENT_AMBULATORY_CARE_PROVIDER_SITE_OTHER): Payer: Self-pay | Admitting: Otolaryngology

## 2020-05-18 ENCOUNTER — Other Ambulatory Visit: Payer: Self-pay

## 2020-05-18 VITALS — Temp 97.3°F

## 2020-05-18 DIAGNOSIS — H9041 Sensorineural hearing loss, unilateral, right ear, with unrestricted hearing on the contralateral side: Secondary | ICD-10-CM

## 2020-05-18 DIAGNOSIS — H6123 Impacted cerumen, bilateral: Secondary | ICD-10-CM | POA: Diagnosis not present

## 2020-05-18 NOTE — Progress Notes (Signed)
HPI: Jacob Christian is a 80 y.o. male who returns today for evaluation of obstruction of his right ear canal where he wears his hearing aid.  He has had a new hearing aid in the right ear for about 8 months.  He has severe hearing loss in the left ear and his audiologist told him that a hearing aid would not help the left ear.  He recently had blockage of his right ear and washed out with hydroperoxide in water.  Is still feels a little blocked but is doing better..  Past Medical History:  Diagnosis Date  . Anxiety   . Aortic stenosis   . Arthritis   . COPD (chronic obstructive pulmonary disease) ([place])    "borderline"  . Dermatitis   . GERD (gastroesophageal reflux disease)   . Hearing loss   . Heart murmur    "leaky valve" being monitored  . Hypertension   . Leg lesion   . Peripheral vascular disease ([place])   . Retroperitoneal mass LUQ s/p lap-assisted resection & left adrenalectomy 05/12/2014 05/12/2014  . Sciatica    "occasional"-right side   Past Surgical History:  Procedure Laterality Date  . LAPAROSCOPIC ADRENALECTOMY Left 05/12/2014   Procedure: LAPAROSCOPIC LEFT ADRENALECTOMY;  Surgeon: Michael Boston, MD;  Location: [facility];  Service: General;  Laterality: Left;  . Leg fracture repair Right 1996   metal plate in place, with bone graft   Social History   Socioeconomic History  . Marital status: Divorced    Spouse name: Not on file  . Number of children: Not on file  . Years of education: Not on file  . Highest education level: Not on file  Occupational History  . Not on file  Tobacco Use  . Smoking status: Former Smoker    Packs/day: 0.50    Years: 50.00    Pack years: 25.00    Types: Cigarettes    Quit date: 2010    Years since quitting: 12.1  . Smokeless tobacco: Never Used  Substance and Sexual Activity  . Alcohol use: Yes    Alcohol/week: 2.0 - 3.0 standard drinks    Types: 2 - 3 Glasses of wine per week  . Drug use: Yes    Types: Marijuana    Comment:  once daily  . Sexual activity: Not on file  Other Topics Concern  . Not on file  Social History Narrative  . Not on file   Social Determinants of Health   Financial Resource Strain: Not on file  Food Insecurity: Not on file  Transportation Needs: Not on file  Physical Activity: Not on file  Stress: Not on file  Social Connections: Not on file   Family History  Problem Relation Age of Onset  . Throat cancer Mother   . CVA Father   . Hypertension Father   . CVA Brother   . Pancreatic cancer Maternal Uncle   . Throat cancer Maternal Grandmother   . Colon cancer Brother    Allergies  Allergen Reactions  . Citalopram Hydrobromide     Pt did not like the way the pill made him feel " just didn't care about anything"  . Codeine Itching    PT denies Codeine allergy  . Dilaudid [Hydromorphone Hcl]   . Tramadol   . Vicodin [Hydrocodone-Acetaminophen]    Prior to Admission medications   Medication Sig Start Date End Date Taking? Authorizing Provider  ALPRAZolam Duanne Moron) 1 MG tablet Take 1 mg by mouth 3 (three) times  daily as needed. 10/20/19   [provider]  Azelastine HCl 137 MCG/SPRAY SOLN USE 1 SRAY IN EACH NOSTRIL TWICE A DAY AS NEEDED 12/06/18   [provider]  diltiazem (CARDIZEM CD) 180 MG 24 hr capsule TAKE 1 CAPSULE BY MOUTH EVERY DAY 02/16/20   Fenton, Clint R, PA  ELIQUIS 5 MG TABS tablet TAKE 1 TABLET BY MOUTH TWICE A DAY 05/04/20   Fenton, Clint R, PA  fluticasone (FLONASE) 50 MCG/ACT nasal spray Place 1 spray into both nostrils daily. 10/20/19   [provider]  HYDROcodone-acetaminophen (NORCO/VICODIN) 5-325 MG per tablet Taking 1.5 tablet in the am, 1 tablet at lunch and 1.5 tablet at bedtime 02/03/14   [provider]  lisinopril (PRINIVIL,ZESTRIL) 40 MG tablet Take 40 mg by mouth daily.    [provider]  loratadine (CLARITIN) 10 MG tablet Take 10 mg by mouth daily.    [provider]  montelukast (SINGULAIR) 10 MG  tablet Take 10 mg by mouth daily. 11/14/19   [provider]  Multiple Vitamin (ONE-A-DAY MENS PO) Take by mouth daily. Centrum silver    [provider]  Multiple Vitamins-Minerals (ZINC PO) Taking 1 tablet every other day     [provider]  pantoprazole (PROTONIX) 40 MG tablet Take 40 mg by mouth daily. 12/20/19   [provider]  polyvinyl alcohol (LIQUIFILM TEARS) 1.4 % ophthalmic solution Place 1 drop into both eyes 3 (three) times daily as needed for dry eyes.    [provider]  sertraline (ZOLOFT) 100 MG tablet Take 100 mg by mouth daily.    [provider]  triamcinolone cream (KENALOG) 0.5 % APPLY SPARINGLY TO AFFECTED AREA TWICE A DAY AS NEEDED 07/27/19   [provider]  vitamin B-12 (CYANOCOBALAMIN) 1000 MCG tablet Take 1,000 mcg by mouth every other day.     [provider]  vitamin C (ASCORBIC ACID) 500 MG tablet Take 500 mg by mouth every other day.     [provider]  VITAMIN D, CHOLECALCIFEROL, PO Take by mouth every other day.     [provider]  vitamin E (VITAMIN E) 400 UNIT capsule Take 400 Units by mouth every other day.     [provider]     Positive ROS: Otherwise negative  All other systems have been reviewed and were otherwise negative with the exception of those mentioned in the HPI and as above.  Physical Exam: Constitutional: Alert, well-appearing, no acute distress Ears: External ears without lesions or tenderness.  On the left side he had minimal wax buildup that was cleaned with a curette.  Ear canal and TM was otherwise clear.  On the right side he has little bit more debris within the right ear canal that was cleaned with suction.  The TM itself was clear with no signs of infection.  Ear canal was minimally moist but no signs of infection.  On tuning fork testing he has some hearing in the left ear. Nasal: External nose without lesions. Septum mildly deviated.  Clear nasal passages Oral: Lips and gums without lesions. Tongue and palate mucosa without lesions. Posterior oropharynx clear. Neck: No palpable adenopathy or masses Respiratory: Breathing comfortably  Skin: No facial/neck lesions or rash noted.  Cerumen impaction removal  Date/Time: 05/18/2020 4:41 PM Performed by: Rozetta Nunnery, MD Authorized by: Rozetta Nunnery, MD   Consent:    Consent obtained:  Verbal   Consent given by:  Patient  Risks discussed:  Pain and bleeding Procedure details:    Location:  L ear and R ear   Procedure type: curette and suction   Post-procedure details:    Inspection:  TM intact and canal normal   Hearing quality:  Improved   Patient tolerance of procedure:  Tolerated well, no immediate complications Comments:     TMs are clear bilaterally.    Assessment: Wax buildup in his ears with minimal hearing on the left side  Plan: Ear canals were cleaned in the office and discussed with him concerning possibly seeing our audiologist concerning any options for hearing aid on the left side.  He will follow-up with his audiologist that sold him the hearing aid concerning adjusting the right hearing aid.   Radene Journey, MD

## 2020-06-01 DIAGNOSIS — E871 Hypo-osmolality and hyponatremia: Secondary | ICD-10-CM | POA: Diagnosis not present

## 2020-06-01 DIAGNOSIS — D6869 Other thrombophilia: Secondary | ICD-10-CM | POA: Diagnosis not present

## 2020-06-01 DIAGNOSIS — I10 Essential (primary) hypertension: Secondary | ICD-10-CM | POA: Diagnosis not present

## 2020-06-01 DIAGNOSIS — J309 Allergic rhinitis, unspecified: Secondary | ICD-10-CM | POA: Diagnosis not present

## 2020-06-01 DIAGNOSIS — M150 Primary generalized (osteo)arthritis: Secondary | ICD-10-CM | POA: Diagnosis not present

## 2020-06-01 DIAGNOSIS — I480 Paroxysmal atrial fibrillation: Secondary | ICD-10-CM | POA: Diagnosis not present

## 2020-06-01 DIAGNOSIS — Z0000 Encounter for general adult medical examination without abnormal findings: Secondary | ICD-10-CM | POA: Diagnosis not present

## 2020-06-26 ENCOUNTER — Ambulatory Visit (HOSPITAL_COMMUNITY)
Admission: RE | Admit: 2020-06-26 | Discharge: 2020-06-26 | Disposition: A | Discharge status: Home / Self Care | Payer: Medicare Other | Source: Ambulatory Visit | Attending: Physician Assistant | Admitting: Physician Assistant

## 2020-06-26 ENCOUNTER — Other Ambulatory Visit: Payer: Self-pay

## 2020-06-26 ENCOUNTER — Encounter (HOSPITAL_COMMUNITY): Payer: Self-pay | Admitting: Physician Assistant

## 2020-06-26 VITALS — BP 128/74 | HR 66 | Ht 71.0 in | Wt 168.6 lb

## 2020-06-26 DIAGNOSIS — I4819 Other persistent atrial fibrillation: Secondary | ICD-10-CM

## 2020-06-26 DIAGNOSIS — I351 Nonrheumatic aortic (valve) insufficiency: Secondary | ICD-10-CM | POA: Diagnosis not present

## 2020-06-26 DIAGNOSIS — Z87891 Personal history of nicotine dependence: Secondary | ICD-10-CM | POA: Insufficient documentation

## 2020-06-26 DIAGNOSIS — Z7901 Long term (current) use of anticoagulants: Secondary | ICD-10-CM | POA: Diagnosis not present

## 2020-06-26 DIAGNOSIS — Z7951 Long term (current) use of inhaled steroids: Secondary | ICD-10-CM | POA: Insufficient documentation

## 2020-06-26 DIAGNOSIS — I10 Essential (primary) hypertension: Secondary | ICD-10-CM | POA: Diagnosis not present

## 2020-06-26 DIAGNOSIS — I4439 Other atrioventricular block: Secondary | ICD-10-CM | POA: Diagnosis not present

## 2020-06-26 DIAGNOSIS — I4892 Unspecified atrial flutter: Secondary | ICD-10-CM | POA: Insufficient documentation

## 2020-06-26 DIAGNOSIS — Z79899 Other long term (current) drug therapy: Secondary | ICD-10-CM | POA: Insufficient documentation

## 2020-06-26 DIAGNOSIS — J449 Chronic obstructive pulmonary disease, unspecified: Secondary | ICD-10-CM | POA: Diagnosis not present

## 2020-06-26 DIAGNOSIS — D6869 Other thrombophilia: Secondary | ICD-10-CM

## 2020-06-26 DIAGNOSIS — I350 Nonrheumatic aortic (valve) stenosis: Secondary | ICD-10-CM | POA: Diagnosis not present

## 2020-06-26 LAB — CBC
HCT: 36.8 % — ABNORMAL LOW (ref 39.0–52.0)
Hemoglobin: 12.3 g/dL — ABNORMAL LOW (ref 13.0–17.0)
MCH: 30.3 pg (ref 26.0–34.0)
MCHC: 33.4 g/dL (ref 30.0–36.0)
MCV: 90.6 fL (ref 80.0–100.0)
Platelets: 427 10*3/uL — ABNORMAL HIGH (ref 150–400)
RBC: 4.06 MIL/uL — ABNORMAL LOW (ref 4.22–5.81)
RDW: 13.3 % (ref 11.5–15.5)
WBC: 10.7 10*3/uL — ABNORMAL HIGH (ref 4.0–10.5)
nRBC: 0 % (ref 0.0–0.2)

## 2020-06-26 LAB — BASIC METABOLIC PANEL
Anion gap: 8 (ref 5–15)
BUN: 9 mg/dL (ref 8–23)
CO2: 22 mmol/L (ref 22–32)
Calcium: 9.3 mg/dL (ref 8.9–10.3)
Chloride: 98 mmol/L (ref 98–111)
Creatinine, Ser: 0.93 mg/dL (ref 0.61–1.24)
GFR, Estimated: >60 mL/min (ref >60)
Glucose, Bld: 113 mg/dL — ABNORMAL HIGH (ref 70–99)
Potassium: 4.3 mmol/L (ref 3.5–5.1)
Sodium: 128 mmol/L — ABNORMAL LOW (ref 135–145)

## 2020-06-26 NOTE — Progress Notes (Signed)
Primary Care Physician: Alroy Dust, L.Marlou Sa, MD Primary Cardiologist: Dr Radford Pax Primary Electrophysiologist: none Referring Physician: Dr Alden Server is a 80 y.o. male with a history of new onset paroxysmal atrial fibrillation, atrial flutter, PVD, HTN, and COPD who presents for follow up in the [facility].  The patient was initially diagnosed with atrial fibrillation 02/17/19 after it was noted on his event monitor. Patient was noted to have an irregular heart beat on exam at his ENT appointment which prompted a referral to cardiology. The event monitor showed both atrial flutter and atrial fibrillation. He has had some fatigue with exertion but denies palpitations or heart racing. He denies snoring. He has a CHADS2VASC score of 4. Patient had 11% afib burden on his event monitor. He did have rapid rates at times but the median rate was controlled. Patient does report that he was receiving steroid injections for an ear condition just prior to the onset of his symptoms. His symptoms improved on diltiazem.  On follow up today, patient reports that he has done well since his last visit. He denies any symptoms with his arrhythmia, appears persistent now. He denies any bleeding issues on anticoagulation.   Today, he denies symptoms of palpitations, chest pain, orthopnea, PND, lower extremity edema, presyncope, syncope, snoring, daytime somnolence, bleeding, or neurologic sequela. The patient is tolerating medications without difficulties and is otherwise without complaint today.    Atrial Fibrillation Risk Factors:  he does not have symptoms or diagnosis of sleep apnea. he does not have a history of rheumatic fever. he does not have a history of alcohol use. The patient does not have a history of early familial atrial fibrillation or other arrhythmias.  he has a BMI of Body mass index is 23.51 kg/m.Marland Kitchen Filed Weights   06/26/20 1404  Weight: 76.5 kg     Family History  Problem Relation Age of Onset  . Throat cancer Mother   . CVA Father   . Hypertension Father   . CVA Brother   . Pancreatic cancer Maternal Uncle   . Throat cancer Maternal Grandmother   . Colon cancer Brother      Atrial Fibrillation Management history:  Previous antiarrhythmic drugs: none Previous cardioversions: none Previous ablations: none CHADS2VASC score: 4 Anticoagulation history: Eliquis   Past Medical History:  Diagnosis Date  . Anxiety   . Aortic stenosis   . Arthritis   . COPD (chronic obstructive pulmonary disease) ([place])    "borderline"  . Dermatitis   . GERD (gastroesophageal reflux disease)   . Hearing loss   . Heart murmur    "leaky valve" being monitored  . Hypertension   . Leg lesion   . Peripheral vascular disease ([place])   . Retroperitoneal mass LUQ s/p lap-assisted resection & left adrenalectomy 05/12/2014 05/12/2014  . Sciatica    "occasional"-right side   Past Surgical History:  Procedure Laterality Date  . LAPAROSCOPIC ADRENALECTOMY Left 05/12/2014   Procedure: LAPAROSCOPIC LEFT ADRENALECTOMY;  Surgeon: Michael Boston, MD;  Location: [facility];  Service: General;  Laterality: Left;  . Leg fracture repair Right 1996   metal plate in place, with bone graft    Current Outpatient Medications  Medication Sig Dispense Refill  . acetaminophen (TYLENOL) 500 MG tablet 1 tablet as needed    . ALPRAZolam (XANAX) 1 MG tablet Take 1 mg by mouth 3 (three) times daily as needed.    . Azelastine HCl 137 MCG/SPRAY SOLN USE 1 SRAY  IN EACH NOSTRIL TWICE A DAY AS NEEDED    . diltiazem (CARDIZEM CD) 180 MG 24 hr capsule TAKE 1 CAPSULE BY MOUTH EVERY DAY 90 capsule 1  . ELIQUIS 5 MG TABS tablet TAKE 1 TABLET BY MOUTH TWICE A DAY 60 tablet 11  . fluticasone (FLONASE) 50 MCG/ACT nasal spray Place 1 spray into both nostrils daily.    Marland Kitchen lisinopril (PRINIVIL,ZESTRIL) 40 MG tablet Take 40 mg by mouth daily.    Marland Kitchen loratadine (CLARITIN) 10 MG tablet Take  10 mg by mouth daily.    . Multiple Vitamin (ONE-A-DAY MENS PO) Take by mouth daily. Centrum silver    . Multiple Vitamins-Minerals (ZINC PO) Taking 1 tablet every other day    . Oxycodone HCl 10 MG TABS 1 TABLET ORALLY THREE TIMES PER DAY AS NEEDED FOR PAIN    . polyvinyl alcohol (LIQUIFILM TEARS) 1.4 % ophthalmic solution Place 1 drop into both eyes 3 (three) times daily as needed for dry eyes.    . pregabalin (LYRICA) 75 MG capsule 1 CAPSULE ORALLY TWICE PER DAY    . sertraline (ZOLOFT) 100 MG tablet Take 100 mg by mouth daily.    Marland Kitchen triamcinolone cream (KENALOG) 0.5 % APPLY SPARINGLY TO AFFECTED AREA TWICE A DAY AS NEEDED    . vitamin B-12 (CYANOCOBALAMIN) 1000 MCG tablet Take 1,000 mcg by mouth every other day.     . vitamin C (ASCORBIC ACID) 500 MG tablet Take 500 mg by mouth every other day.     Marland Kitchen VITAMIN D, CHOLECALCIFEROL, PO Take by mouth every other day.     . vitamin E 180 MG (400 UNITS) capsule Take 400 Units by mouth every other day.      No current facility-administered medications for this encounter.    Allergies  Allergen Reactions  . Citalopram Hydrobromide     Pt did not like the way the pill made him feel " just didn't care about anything"  . Codeine Itching    PT denies Codeine allergy  . Dilaudid [Hydromorphone Hcl]   . Tramadol   . Vicodin [Hydrocodone-Acetaminophen]     Social History   Socioeconomic History  . Marital status: Divorced    Spouse name: Not on file  . Number of children: Not on file  . Years of education: Not on file  . Highest education level: Not on file  Occupational History  . Not on file  Tobacco Use  . Smoking status: Former Smoker    Packs/day: 0.50    Years: 50.00    Pack years: 25.00    Types: Cigarettes    Quit date: 2010    Years since quitting: 12.2  . Smokeless tobacco: Never Used  Substance and Sexual Activity  . Alcohol use: Yes    Alcohol/week: 2.0 - 3.0 standard drinks    Types: 2 - 3 Glasses of wine per week  .  Drug use: Yes    Types: Marijuana    Comment: once daily  . Sexual activity: Not on file  Other Topics Concern  . Not on file  Social History Narrative  . Not on file   Social Determinants of Health   Financial Resource Strain: Not on file  Food Insecurity: Not on file  Transportation Needs: Not on file  Physical Activity: Not on file  Stress: Not on file  Social Connections: Not on file  Intimate Partner Violence: Not on file     ROS- All systems are reviewed and negative except  as per the HPI above.  Physical Exam: Vitals:   06/26/20 1404  BP: 128/74  Pulse: 66  Weight: 76.5 kg  Height: 5\' 11"  (1.803 m)    GEN- The patient is a well appearing male, alert and oriented x 3 today.   HEENT-head normocephalic, atraumatic, sclera clear, conjunctiva pink, hearing intact, trachea midline. Lungs- Clear to ausculation bilaterally, normal work of breathing Heart- irregular rate and rhythm, no murmurs, rubs or gallops  GI- soft, NT, ND, + BS Extremities- no clubbing, cyanosis, or edema MS- no significant deformity or atrophy Skin- no rash or lesion Psych- euthymic mood, full affect Neuro- strength and sensation are intact   Wt Readings from Last 3 Encounters:  06/26/20 76.5 kg  12/27/19 83 kg  10/25/19 81.6 kg    EKG today demonstrates  Coarse afib vs atypical atrial flutter with variable block Vent. rate 66 BPM PR interval * ms QRS duration 96 ms QT/QTc 432/452 ms  Echo 02/02/19 demonstrated  1. Left ventricular ejection fraction, by visual estimation, is 55 to 60%. The left ventricle has normal function. Normal left ventricular size. There is moderately increased left ventricular hypertrophy. Normal wall motion.  2. Left ventricular diastolic Doppler parameters are consistent with pseudonormalization pattern of LV diastolic filling.  3. Moderate calcification of the mitral valve leaflet(s).  4. Moderate mitral annular calcification.  5. The mitral valve is  degenerative. Trivial mitral valve regurgitation. No evidence of mitral stenosis. Mean gradient 3.5 mmHg across mitral valve.  6. The aortic valve is tricuspid. Aortic valve regurgitation is mild by color flow Doppler. Moderate aortic valve stenosis. Mean gradient 26 mmHg with AVA 1.25 cm^2  7. Left atrial size was moderately dilated.  8. Global right ventricle has normal systolic function.The right ventricular size is normal. No increase in right ventricular wall thickness.  9. Right atrial size was normal. 10. The tricuspid valve is normal in structure. Tricuspid valve regurgitation is trivial. 11. The inferior vena cava is normal in size with greater than 50% respiratory variability, suggesting right atrial pressure of 3 mmHg. 12. TR signal is inadequate for assessing pulmonary artery systolic pressure.  Epic records are reviewed at length today  Assessment and Plan:  1. Persistent atrial fibrillation/atrial flutter Patient appears persistently out of rhythm, rate controlled, asymptomatic. Discussed options with patient again today. He prefers to manage conservatively with rate control.  Continue Eliquis 5 mg BID Continue diltiazem 180 mg daily  This patients CHA2DS2-VASc Score and unadjusted Ischemic Stroke Rate (% per year) is equal to 4.8 % stroke rate/year from a score of 4  Above score calculated as 1 point each if present [CHF, HTN, DM, Vascular=MI/PAD/Aortic Plaque, Age if 65-74, or Male] Above score calculated as 2 points each if present [Age > 75, or Stroke/TIA/TE]  2. HTN Stable, no changes today.  3. Aortic stenosis Moderate by last echo.   Follow up with Dr Radford Pax in 3 months. AF clinic as needed.    [facility] [address] [place], [place] [address] [phone] 06/26/2020 2:15 PM

## 2020-07-10 ENCOUNTER — Other Ambulatory Visit (HOSPITAL_COMMUNITY): Payer: Self-pay

## 2020-07-10 MED ORDER — APIXABAN 5 MG PO TABS: 5.0000 mg | ORAL_TABLET | Freq: Two times a day (BID) | ORAL | 11 refills | Status: DC

## 2020-09-12 ENCOUNTER — Ambulatory Visit: Payer: Medicare Other | Admitting: Cardiology

## 2020-09-27 DIAGNOSIS — J449 Chronic obstructive pulmonary disease, unspecified: Secondary | ICD-10-CM | POA: Diagnosis not present

## 2020-09-27 DIAGNOSIS — R5383 Other fatigue: Secondary | ICD-10-CM | POA: Diagnosis not present

## 2020-09-27 DIAGNOSIS — M545 Low back pain, unspecified: Secondary | ICD-10-CM | POA: Diagnosis not present

## 2020-09-27 DIAGNOSIS — I350 Nonrheumatic aortic (valve) stenosis: Secondary | ICD-10-CM | POA: Diagnosis not present

## 2020-10-01 ENCOUNTER — Telehealth: Payer: Self-pay | Admitting: Interventional Cardiology

## 2020-10-01 DIAGNOSIS — I359 Nonrheumatic aortic valve disorder, unspecified: Secondary | ICD-10-CM

## 2020-10-01 NOTE — Telephone Encounter (Signed)
Spoke with the patient who states that he has been more fatigued recently. He states that he has SOB when he climbs stairs but denies any SOB at rest. Denies any chest pain, dizziness, or palpitations. He states that he has been worked up by his PCP who advised that he follow up with cardiology to reevaluate his aortic stenosis. Patient is currently scheduled for 9/30 and has been added to the wait list for a sooner appointment. Patient will call back if symptoms worsen or new symptoms arise. Patient verbalized understanding.

## 2020-10-01 NOTE — Telephone Encounter (Signed)
New message:     Patient calling stating he ned to speak with someone concering him not feeling well. I'm having a hard time understanding what the patient is saying. Please call patient back. Patient also have hearing aid and leave message if not answer.

## 2020-10-02 NOTE — Telephone Encounter (Signed)
PT is calling back ato find out from the nurse some other options he can do to help his problem

## 2020-10-02 NOTE — Telephone Encounter (Signed)
Patient calling back because he is feeling worse. He states that he is extremely fatigued and had trouble getting out of bed this morning. He is still having shortness of breath and tells me now that it is with minimal exertion. States that symptoms have been going on for about two weeks. Still denies chest pain, swelling or dizziness. ER precautions have been reviewed with the patient.  Will route to Dr. Radford Pax to see if testing/labs needed prior to patient getting in to be seen.

## 2020-10-03 NOTE — Telephone Encounter (Signed)
Left message for patient to call back.  Echocardiogram has been ordered.  Patient has been scheduled with DOD, tomorrow 6/23.

## 2020-10-03 NOTE — Telephone Encounter (Signed)
Spoke with the patient and he is aware of appointment tomorrow.

## 2020-10-03 NOTE — Addendum Note (Signed)
Addended by: Antonieta Iba on: 10/03/2020 08:17 AM   Modules accepted: Orders

## 2020-10-04 ENCOUNTER — Ambulatory Visit (HOSPITAL_COMMUNITY): Payer: Medicare Other | Attending: Cardiology

## 2020-10-04 ENCOUNTER — Encounter: Payer: Self-pay | Admitting: Internal Medicine

## 2020-10-04 ENCOUNTER — Other Ambulatory Visit: Payer: Self-pay

## 2020-10-04 ENCOUNTER — Ambulatory Visit: Payer: Medicare Other | Admitting: Internal Medicine

## 2020-10-04 VITALS — BP 116/70 | HR 62 | Ht 71.0 in | Wt 158.2 lb

## 2020-10-04 DIAGNOSIS — I4819 Other persistent atrial fibrillation: Secondary | ICD-10-CM | POA: Diagnosis not present

## 2020-10-04 DIAGNOSIS — I359 Nonrheumatic aortic valve disorder, unspecified: Secondary | ICD-10-CM | POA: Insufficient documentation

## 2020-10-04 DIAGNOSIS — R0602 Shortness of breath: Secondary | ICD-10-CM | POA: Diagnosis not present

## 2020-10-04 MED ORDER — FUROSEMIDE 20 MG PO TABS: 20.0000 mg | ORAL_TABLET | Freq: Every day | ORAL | 3 refills | Status: DC

## 2020-10-04 NOTE — Progress Notes (Signed)
HPI Jacob Christian is referred by Dr. Radford Pax for evaluation of atrial fib and flutter. He is a pleasant 80 yo man with AS and a mean gradient of 26 almost 2 years ago. He notes that over the past several months his energy level has decreased markedly and is associated with sob. He has not sought additional medical attention. He does not take any diuretics. He notes that he has a hard time walking. Repeat 2D echo today is pending. He denies syncope or chest pain. He wore a cardiac monitor demonstrating both atrial fib and flutter though his rates were mostly controlled.  Allergies  Allergen Reactions   Citalopram Hydrobromide     Pt did not like the way the pill made him feel " just didn't care about anything"   Codeine Itching    PT denies Codeine allergy   Dilaudid [Hydromorphone Hcl]    Tramadol    Vicodin [Hydrocodone-Acetaminophen]      Current Outpatient Medications  Medication Sig Dispense Refill   ALPRAZolam (XANAX) 1 MG tablet Take 1 mg by mouth 3 (three) times daily as needed.     apixaban (ELIQUIS) 5 MG TABS tablet Take 1 tablet (5 mg total) by mouth 2 (two) times daily. 60 tablet 11   Azelastine HCl 137 MCG/SPRAY SOLN USE 1 SRAY IN EACH NOSTRIL TWICE A DAY AS NEEDED     diltiazem (CARDIZEM CD) 180 MG 24 hr capsule TAKE 1 CAPSULE BY MOUTH EVERY DAY 90 capsule 1   fluticasone (FLONASE) 50 MCG/ACT nasal spray Place 1 spray into both nostrils daily.     lisinopril (PRINIVIL,ZESTRIL) 40 MG tablet Take 40 mg by mouth daily.     loratadine (CLARITIN) 10 MG tablet Take 10 mg by mouth daily.     Multiple Vitamin (ONE-A-DAY MENS PO) Take by mouth daily. Centrum silver     Multiple Vitamins-Minerals (ZINC PO) Taking 1 tablet every other day     Oxycodone HCl 10 MG TABS 1 TABLET ORALLY THREE TIMES PER DAY AS NEEDED FOR PAIN     polyvinyl alcohol (LIQUIFILM TEARS) 1.4 % ophthalmic solution Place 1 drop into both eyes 3 (three) times daily as needed for dry eyes.     pregabalin  (LYRICA) 75 MG capsule 1 CAPSULE ORALLY TWICE PER DAY     sertraline (ZOLOFT) 100 MG tablet Take 100 mg by mouth daily.     triamcinolone cream (KENALOG) 0.5 % APPLY SPARINGLY TO AFFECTED AREA TWICE A DAY AS NEEDED     vitamin B-12 (CYANOCOBALAMIN) 1000 MCG tablet Take 1,000 mcg by mouth every other day.      vitamin C (ASCORBIC ACID) 500 MG tablet Take 500 mg by mouth every other day.      VITAMIN D, CHOLECALCIFEROL, PO Take by mouth every other day.      vitamin E 180 MG (400 UNITS) capsule Take 400 Units by mouth every other day.      No current facility-administered medications for this visit.     Past Medical History:  Diagnosis Date   Anxiety    Aortic stenosis    Arthritis    COPD (chronic obstructive pulmonary disease) ([place])    "borderline"   Dermatitis    GERD (gastroesophageal reflux disease)    Hearing loss    Heart murmur    "leaky valve" being monitored   Hypertension    Leg lesion    Peripheral vascular disease ([facility])    Retroperitoneal mass LUQ s/p lap-assisted  resection & left adrenalectomy 05/12/2014 05/12/2014   Sciatica    "occasional"-right side    ROS:   All systems reviewed and negative except as noted in the HPI.   Past Surgical History:  Procedure Laterality Date   LAPAROSCOPIC ADRENALECTOMY Left 05/12/2014   Procedure: LAPAROSCOPIC LEFT ADRENALECTOMY;  Surgeon: Michael Boston, MD;  Location: [facility];  Service: General;  Laterality: Left;   Leg fracture repair Right 1996   metal plate in place, with bone graft     Family History  Problem Relation Age of Onset   Throat cancer Mother    CVA Father    Hypertension Father    CVA Brother    Pancreatic cancer Maternal Uncle    Throat cancer Maternal Grandmother    Colon cancer Brother      Social History   Socioeconomic History   Marital status: Divorced    Spouse name: Not on file   Number of children: Not on file   Years of education: Not on file   Highest education level: Not on file   Occupational History   Not on file  Tobacco Use   Smoking status: Former    Packs/day: 0.50    Years: 50.00    Pack years: 25.00    Types: Cigarettes    Quit date: 2010    Years since quitting: 12.4   Smokeless tobacco: Never  Substance and Sexual Activity   Alcohol use: Yes    Alcohol/week: 2.0 - 3.0 standard drinks    Types: 2 - 3 Glasses of wine per week   Drug use: Yes    Types: Marijuana    Comment: once daily   Sexual activity: Not on file  Other Topics Concern   Not on file  Social History Narrative   Not on file   Social Determinants of Health   Financial Resource Strain: Not on file  Food Insecurity: Not on file  Transportation Needs: Not on file  Physical Activity: Not on file  Stress: Not on file  Social Connections: Not on file  Intimate Partner Violence: Not on file     BP 116/70   Pulse 62   Ht 5\' 11"  (1.803 m)   Wt 158 lb 3.2 oz (71.8 kg)   SpO2 98%   BMI 22.06 kg/m   Physical Exam:  Chronically ill appearing 80 yo man, NAD HEENT: Unremarkable Neck:  No JVD, no thyromegally Lymphatics:  No adenopathy Back:  No CVA tenderness Lungs:  Clear with scattered rales. HEART:  IRegular rate rhythm, 1/6 systolic murmurs, no rubs, no clicks Abd:  soft, positive bowel sounds, no organomegally, no rebound, no guarding Ext:  2 plus pulses, no edema, no cyanosis, no clubbing Skin:  No rashes no nodules Neuro:  CN II through XII intact, motor grossly intact  EKG - atrial fib/flutter with a controlled VR   Assess/Plan:  Atrial fib/flutter - I think he might be a candidate for initiation of dofetilide therapy. However, we will see if he has surgical AS. AS - he has a repeat echo 18 months after the last when his mean gradient was 26. I suspect that the AS is playing a role. Diastolic heart failure -I asked that the patient start low dose lasix. We will check labs in 2 weeks.  Carleene Overlie Keilee Denman,MD

## 2020-10-04 NOTE — Patient Instructions (Signed)
Medication Instructions:  Your physician has recommended you make the following change in your medication:    START taking lasix 20 mg-  Take one tablet by mouth daily   *If you need a refill on your cardiac medications before your next appointment, please call your pharmacy*   Lab Work: You will come to the [facility] office in 2 weeks for a BMP: October 18, 2020 any time after 7;30 am and before 5:00 pm. You do not need to be fasting.  If you have labs (blood work) drawn today and your tests are completely normal, you will receive your results only by: [place] (if you have MyChart) OR A paper copy in the mail If you have any lab test that is abnormal or we need to change your treatment, we will call you to review the results.   Testing/Procedures: None ordered.  Follow-Up: At [facility], you and your health needs are our priority.  As part of our continuing mission to provide you with exceptional heart care, we have created designated Provider Care Teams.  These Care Teams include your primary Cardiologist (physician) and Advanced Practice Providers (APPs -  Physician Assistants and Nurse Practitioners) who all work together to provide you with the care you need, when you need it.  Follow up as scheduled with Dr. Radford Pax.  She will call you with the results of your ECHO.

## 2020-10-05 LAB — ECHOCARDIOGRAM COMPLETE
AR max vel: 1.1 cm2
AV Area VTI: 1.13 cm2
AV Mean grad: 21 mmHg
AV Peak grad: 31.1 mmHg
Ao pk vel: 2.79 m/s
Area-P 1/2: 2.78 cm2
P 1/2 time: 564 msec
S' Lateral: 2.2 cm

## 2020-10-18 ENCOUNTER — Other Ambulatory Visit: Payer: Self-pay

## 2020-10-18 ENCOUNTER — Other Ambulatory Visit: Payer: Medicare Other

## 2020-10-18 DIAGNOSIS — I359 Nonrheumatic aortic valve disorder, unspecified: Secondary | ICD-10-CM

## 2020-10-18 DIAGNOSIS — I4819 Other persistent atrial fibrillation: Secondary | ICD-10-CM

## 2020-10-18 DIAGNOSIS — R0602 Shortness of breath: Secondary | ICD-10-CM

## 2020-10-19 LAB — BASIC METABOLIC PANEL
BUN/Creatinine Ratio: 12 (ref 10–24)
BUN: 13 mg/dL (ref 8–27)
CO2: 22 mmol/L (ref 20–29)
Calcium: 9.5 mg/dL (ref 8.6–10.2)
Chloride: 97 mmol/L (ref 96–106)
Creatinine, Ser: 1.09 mg/dL (ref 0.76–1.27)
Glucose: 133 mg/dL — ABNORMAL HIGH (ref 65–99)
Potassium: 4.1 mmol/L (ref 3.5–5.2)
Sodium: 136 mmol/L (ref 134–144)
eGFR: 70 mL/min/{1.73_m2} (ref >59)

## 2020-11-07 ENCOUNTER — Telehealth: Payer: Self-pay | Admitting: Cardiology

## 2020-11-07 NOTE — Telephone Encounter (Signed)
Spoke with the patient. I am unsure who called and left a message for him. Advised him on his upcoming appointment with Dr. Radford Pax. No further concerns at this time.

## 2020-11-07 NOTE — Telephone Encounter (Signed)
Jacob Christian is calling stating he is returning a phone call. He states they left a VM advising him to callback to Dr. Theodosia Blender office and he received it today. I was unable to find any documentation as to what this was in regards to. Please advise.

## 2020-11-08 DIAGNOSIS — R739 Hyperglycemia, unspecified: Secondary | ICD-10-CM | POA: Diagnosis not present

## 2020-11-14 ENCOUNTER — Emergency Department (HOSPITAL_COMMUNITY): Payer: Medicare Other

## 2020-11-14 ENCOUNTER — Other Ambulatory Visit: Payer: Self-pay

## 2020-11-14 ENCOUNTER — Emergency Department (HOSPITAL_COMMUNITY)
Admission: EM | Admit: 2020-11-14 | Discharge: 2020-11-14 | Disposition: A | Discharge status: Home / Self Care | Payer: Medicare Other | Attending: Emergency Medicine | Admitting: Emergency Medicine

## 2020-11-14 ENCOUNTER — Encounter (HOSPITAL_COMMUNITY): Payer: Self-pay

## 2020-11-14 DIAGNOSIS — J441 Chronic obstructive pulmonary disease with (acute) exacerbation: Secondary | ICD-10-CM | POA: Insufficient documentation

## 2020-11-14 DIAGNOSIS — Z87891 Personal history of nicotine dependence: Secondary | ICD-10-CM | POA: Diagnosis not present

## 2020-11-14 DIAGNOSIS — Z7901 Long term (current) use of anticoagulants: Secondary | ICD-10-CM | POA: Insufficient documentation

## 2020-11-14 DIAGNOSIS — I10 Essential (primary) hypertension: Secondary | ICD-10-CM | POA: Diagnosis not present

## 2020-11-14 DIAGNOSIS — Z20822 Contact with and (suspected) exposure to covid-19: Secondary | ICD-10-CM | POA: Insufficient documentation

## 2020-11-14 DIAGNOSIS — Z7951 Long term (current) use of inhaled steroids: Secondary | ICD-10-CM | POA: Diagnosis not present

## 2020-11-14 DIAGNOSIS — R197 Diarrhea, unspecified: Secondary | ICD-10-CM | POA: Insufficient documentation

## 2020-11-14 DIAGNOSIS — Z79899 Other long term (current) drug therapy: Secondary | ICD-10-CM | POA: Diagnosis not present

## 2020-11-14 DIAGNOSIS — E860 Dehydration: Secondary | ICD-10-CM | POA: Insufficient documentation

## 2020-11-14 DIAGNOSIS — R0602 Shortness of breath: Secondary | ICD-10-CM | POA: Diagnosis not present

## 2020-11-14 DIAGNOSIS — I4891 Unspecified atrial fibrillation: Secondary | ICD-10-CM | POA: Diagnosis not present

## 2020-11-14 LAB — URINALYSIS, ROUTINE W REFLEX MICROSCOPIC
Bilirubin Urine: NEGATIVE
Glucose, UA: NEGATIVE mg/dL
Ketones, ur: NEGATIVE mg/dL
Leukocytes,Ua: NEGATIVE
Nitrite: NEGATIVE
Protein, ur: NEGATIVE mg/dL
Specific Gravity, Urine: 1.011 (ref 1.005–1.030)
pH: 7 (ref 5.0–8.0)

## 2020-11-14 LAB — CBC
HCT: 39.3 % (ref 39.0–52.0)
Hemoglobin: 13.9 g/dL (ref 13.0–17.0)
MCH: 30.3 pg (ref 26.0–34.0)
MCHC: 35.4 g/dL (ref 30.0–36.0)
MCV: 85.6 fL (ref 80.0–100.0)
Platelets: 355 10*3/uL (ref 150–400)
RBC: 4.59 MIL/uL (ref 4.22–5.81)
RDW: 14.6 % (ref 11.5–15.5)
WBC: 12.1 10*3/uL — ABNORMAL HIGH (ref 4.0–10.5)
nRBC: 0 % (ref 0.0–0.2)

## 2020-11-14 LAB — HEPATIC FUNCTION PANEL
ALT: 11 U/L (ref 0–44)
AST: 20 U/L (ref 15–41)
Albumin: 4.8 g/dL (ref 3.5–5.0)
Alkaline Phosphatase: 86 U/L (ref 38–126)
Bilirubin, Direct: <0.1 mg/dL (ref 0.0–0.2)
Total Bilirubin: 0.7 mg/dL (ref 0.3–1.2)
Total Protein: 8.2 g/dL — ABNORMAL HIGH (ref 6.5–8.1)

## 2020-11-14 LAB — C DIFFICILE QUICK SCREEN W PCR REFLEX
C Diff antigen: NEGATIVE
C Diff interpretation: NOT DETECTED
C Diff toxin: NEGATIVE

## 2020-11-14 LAB — BASIC METABOLIC PANEL
Anion gap: 17 — ABNORMAL HIGH (ref 5–15)
BUN: 7 mg/dL — ABNORMAL LOW (ref 8–23)
CO2: 24 mmol/L (ref 22–32)
Calcium: 9.9 mg/dL (ref 8.9–10.3)
Chloride: 91 mmol/L — ABNORMAL LOW (ref 98–111)
Creatinine, Ser: 0.74 mg/dL (ref 0.61–1.24)
GFR, Estimated: >60 mL/min (ref >60)
Glucose, Bld: 117 mg/dL — ABNORMAL HIGH (ref 70–99)
Potassium: 3.3 mmol/L — ABNORMAL LOW (ref 3.5–5.1)
Sodium: 132 mmol/L — ABNORMAL LOW (ref 135–145)

## 2020-11-14 LAB — RESP PANEL BY RT-PCR (FLU A&B, COVID) ARPGX2
Influenza A by PCR: NEGATIVE
Influenza B by PCR: NEGATIVE
SARS Coronavirus 2 by RT PCR: NEGATIVE

## 2020-11-14 LAB — CBG MONITORING, ED: Glucose-Capillary: 119 mg/dL — ABNORMAL HIGH (ref 70–99)

## 2020-11-14 MED ORDER — PREDNISONE 10 MG (21) PO TBPK: ORAL_TABLET | Freq: Every day | ORAL | 0 refills | Status: DC

## 2020-11-14 MED ORDER — ALBUTEROL SULFATE HFA 108 (90 BASE) MCG/ACT IN AERS
2.0000 | INHALATION_SPRAY | Freq: Once | RESPIRATORY_TRACT | Status: AC
  Administered 2020-11-14: 2 via RESPIRATORY_TRACT
  Filled 2020-11-14: qty 6.7

## 2020-11-14 MED ORDER — IPRATROPIUM-ALBUTEROL 0.5-2.5 (3) MG/3ML IN SOLN
3.0000 mL | Freq: Once | RESPIRATORY_TRACT | Status: AC
  Administered 2020-11-14: 3 mL via RESPIRATORY_TRACT
  Filled 2020-11-14: qty 3

## 2020-11-14 MED ORDER — AEROCHAMBER Z-STAT PLUS/MEDIUM MISC
1.0000 | Freq: Once | Status: AC
  Administered 2020-11-14: 1
  Filled 2020-11-14: qty 1

## 2020-11-14 MED ORDER — SODIUM CHLORIDE 0.9 % IV SOLN: Freq: Once | INTRAVENOUS | Status: AC

## 2020-11-14 NOTE — ED Triage Notes (Signed)
Pt has had increased SHOB and weakness over the past few weeks. Pt recently had severe diarrhea and PCP is concerned about dehydration. Pt denies abdominal pain and chest pain.

## 2020-11-14 NOTE — ED Provider Notes (Signed)
[facility] DEPT Provider Note   CSN: [id number] Arrival date & time: 11/14/20  1005     History Chief Complaint  Patient presents with   Shortness of Breath   Weakness    Jacob Christian is a 80 y.o. male.  Pt presents to the ED today with sob and weakness.  Pt has been sob for several days.  He saw his cardiologist on 6/23 and was started on lasix.  He did not stay on that long because he felt that it made him dehydrated.  Pt has also had diarrhea.  He has not taken any of his meds today other than bp meds.  Pt denies any fever.  He took a home covid test which was negative.  Pt denies any pain.  His daughter gave him some puffs of her albuterol inhaler which did help sx.      Past Medical History:  Diagnosis Date   Anxiety    Aortic stenosis    Arthritis    COPD (chronic obstructive pulmonary disease) ([place])    "borderline"   Dermatitis    GERD (gastroesophageal reflux disease)    Hearing loss    Heart murmur    "leaky valve" being monitored   Hypertension    Leg lesion    Peripheral vascular disease ([place])    Retroperitoneal mass LUQ s/p lap-assisted resection & left adrenalectomy 05/12/2014 05/12/2014   Sciatica    "occasional"-right side    Patient Active Problem List   Diagnosis Date Noted   Shortness of breath 10/04/2020   Atrial fibrillation ([place]) 02/17/2019   Secondary hypercoagulable state ([place]) 02/17/2019   Atrial flutter ([place]) 02/17/2019   Mass of left adrenal gland ([place]) 05/12/2014   Retroperitoneal mass LUQ s/p lap-assisted resection & left adrenalectomy 05/12/2014 05/12/2014   Carotid bruit 02/08/2014   Benign essential HTN 01/12/2014   Hyperlipidemia 01/12/2014   Aortic valve disorder 01/12/2014    Past Surgical History:  Procedure Laterality Date   LAPAROSCOPIC ADRENALECTOMY Left 05/12/2014   Procedure: LAPAROSCOPIC LEFT ADRENALECTOMY;  Surgeon: Michael Boston, MD;  Location: [facility] ORS;  Service: General;  Laterality:  Left;   Leg fracture repair Right 1996   metal plate in place, with bone graft       Family History  Problem Relation Age of Onset   Throat cancer Mother    CVA Father    Hypertension Father    CVA Brother    Pancreatic cancer Maternal Uncle    Throat cancer Maternal Grandmother    Colon cancer Brother     Social History   Tobacco Use   Smoking status: Former    Packs/day: 0.50    Years: 50.00    Pack years: 25.00    Types: Cigarettes    Quit date: 2010    Years since quitting: 12.5   Smokeless tobacco: Never  Substance Use Topics   Alcohol use: Yes    Alcohol/week: 2.0 - 3.0 standard drinks    Types: 2 - 3 Glasses of wine per week   Drug use: Yes    Types: Marijuana    Comment: once daily    Home Medications Prior to Admission medications   Medication Sig Start Date End Date Taking? Authorizing Provider  acetaminophen (TYLENOL) 500 MG tablet Take 1,000 mg by mouth every 6 (six) hours as needed for mild pain or fever.   Yes [provider]  ALPRAZolam Duanne Moron) 1 MG tablet Take 1 mg by mouth 3 (three) times  daily as needed for anxiety. 10/20/19  Yes [provider]  apixaban (ELIQUIS) 5 MG TABS tablet Take 1 tablet (5 mg total) by mouth 2 (two) times daily. 07/10/20  Yes Fenton, Clint R, PA  Azelastine HCl 137 MCG/SPRAY SOLN Place 1 spray into the nose daily as needed (allergies). 12/06/18  Yes [provider]  diltiazem (CARDIZEM CD) 180 MG 24 hr capsule TAKE 1 CAPSULE BY MOUTH EVERY DAY Patient taking differently: Take 180 mg by mouth daily. 02/16/20  Yes Fenton, Clint R, PA  fluticasone (FLONASE) 50 MCG/ACT nasal spray Place 1 spray into both nostrils at bedtime. 10/20/19  Yes [provider]  lisinopril (PRINIVIL,ZESTRIL) 40 MG tablet Take 40 mg by mouth daily.   Yes [provider]  loratadine (CLARITIN) 10 MG tablet Take 10 mg by mouth daily.   Yes [provider]  Multiple Vitamin (ONE-A-DAY MENS PO) Take by mouth  daily. Centrum silver   Yes [provider]  Oxycodone HCl 10 MG TABS Take 10 mg by mouth 3 (three) times daily as needed (pain). 06/01/20  Yes [provider]  polyvinyl alcohol (LIQUIFILM TEARS) 1.4 % ophthalmic solution Place 1 drop into both eyes 3 (three) times daily as needed for dry eyes.   Yes [provider]  predniSONE (STERAPRED UNI-PAK 21 TAB) 10 MG (21) TBPK tablet Take by mouth daily. Take 6 tabs by mouth daily  for 2 days, then 5 tabs for 2 days, then 4 tabs for 2 days, then 3 tabs for 2 days, 2 tabs for 2 days, then 1 tab by mouth daily for 2 days 11/14/20  Yes Isla Pence, MD  Pyridoxine HCl (VITAMIN B-6 PO) Take 1 tablet by mouth 2 (two) times a week.   Yes [provider]  vitamin B-12 (CYANOCOBALAMIN) 1000 MCG tablet Take 1,000 mcg by mouth 2 (two) times a week.   Yes [provider]  vitamin C (ASCORBIC ACID) 500 MG tablet Take 500 mg by mouth 2 (two) times a week.   Yes [provider]  VITAMIN D-VITAMIN K PO Take 1 tablet by mouth 2 (two) times a week.   Yes [provider]  vitamin E 180 MG (400 UNITS) capsule Take 400 Units by mouth 2 (two) times a week.   Yes [provider]  furosemide (LASIX) 20 MG tablet Take 1 tablet (20 mg total) by mouth daily. Patient not taking: No sig reported 10/04/20 01/02/21  Evans Lance, MD    Allergies    Citalopram hydrobromide, Codeine, Dilaudid [hydromorphone hcl], Tramadol, and Vicodin [hydrocodone-acetaminophen]  Review of Systems   Review of Systems  Respiratory:  Positive for shortness of breath.   Gastrointestinal:  Positive for diarrhea.  All other systems reviewed and are negative.  Physical Exam Updated Vital Signs BP (!) 138/94   Pulse 76   Temp 97.8 F (36.6 C) (Oral)   Resp 20   SpO2 100%   Physical Exam Vitals and nursing note reviewed.  Constitutional:      Appearance: He is well-developed.  HENT:     Head: Normocephalic and  atraumatic.     Mouth/Throat:     Mouth: Mucous membranes are dry.  Eyes:     Extraocular Movements: Extraocular movements intact.     Pupils: Pupils are equal, round, and reactive to light.  Cardiovascular:     Rate and Rhythm: Normal rate. Rhythm irregular.  Pulmonary:     Effort: Tachypnea present.  Abdominal:  General: Bowel sounds are normal.     Palpations: Abdomen is soft.  Musculoskeletal:        General: Normal range of motion.     Cervical back: Normal range of motion and neck supple.  Skin:    General: Skin is warm.     Capillary Refill: Capillary refill takes less than 2 seconds.  Neurological:     General: No focal deficit present.     Mental Status: He is alert and oriented to person, place, and time.  Psychiatric:        Mood and Affect: Mood normal.        Behavior: Behavior normal.    ED Results / Procedures / Treatments   Labs (all labs ordered are listed, but only abnormal results are displayed) Labs Reviewed  BASIC METABOLIC PANEL - Abnormal; Notable for the following components:      Result Value   Sodium 132 (*)    Potassium 3.3 (*)    Chloride 91 (*)    Glucose, Bld 117 (*)    BUN 7 (*)    Anion gap 17 (*)    All other components within normal limits  CBC - Abnormal; Notable for the following components:   WBC 12.1 (*)    All other components within normal limits  URINALYSIS, ROUTINE W REFLEX MICROSCOPIC - Abnormal; Notable for the following components:   APPearance HAZY (*)    Hgb urine dipstick MODERATE (*)    Bacteria, UA RARE (*)    All other components within normal limits  HEPATIC FUNCTION PANEL - Abnormal; Notable for the following components:   Total Protein 8.2 (*)    All other components within normal limits  CBG MONITORING, ED - Abnormal; Notable for the following components:   Glucose-Capillary 119 (*)    All other components within normal limits  RESP PANEL BY RT-PCR (FLU A&B, COVID) ARPGX2  GASTROINTESTINAL PANEL BY PCR,  STOOL (REPLACES STOOL CULTURE)  C DIFFICILE QUICK SCREEN W PCR REFLEX      EKG None  Radiology DG Chest 2 View  Result Date: 11/14/2020 CLINICAL DATA:  Shortness of breath EXAM: CHEST - 2 VIEW COMPARISON:  05/10/2014 FINDINGS: Heart and mediastinal contours are within normal limits. No focal opacities or effusions. No acute bony abnormality. Aortic atherosclerosis. Old proximal left humeral fracture with deformity. IMPRESSION: No active cardiopulmonary disease. Electronically Signed   By: Rolm Baptise M.D.   On: 11/14/2020 11:06    Procedures Procedures   Medications Ordered in ED Medications  albuterol (VENTOLIN HFA) 108 (90 Base) MCG/ACT inhaler 2 puff (has no administration in time range)  aerochamber Z-Stat Plus/medium 1 each (has no administration in time range)  0.9 %  sodium chloride infusion ( Intravenous New Bag/Given 11/14/20 1117)  ipratropium-albuterol (DUONEB) 0.5-2.5 (3) MG/3ML nebulizer solution 3 mL (3 mLs Nebulization Given 11/14/20 1321)    ED Course  I have reviewed the triage vital signs and the nursing notes.  Pertinent labs & imaging results that were available during my care of the patient were reviewed by me and considered in my medical decision making (see chart for details).    MDM Rules/Calculators/A&P                           Pt is feeling much better after fluids and meds.  Pt was finally able to give a stool sample.  Pt is given an albuterol inhaler + spacer at d/c.  He is to f/u with pcp.  Return if worse.  F/u with pcp.   Final Clinical Impression(s) / ED Diagnoses Final diagnoses:  COPD exacerbation ([place])  Dehydration  Diarrhea, unspecified type    Rx / DC Orders ED Discharge Orders          Ordered    predniSONE (STERAPRED UNI-PAK 21 TAB) 10 MG (21) TBPK tablet  Daily        11/14/20 1459             Isla Pence, MD 11/14/20 1504

## 2020-11-15 LAB — GASTROINTESTINAL PANEL BY PCR, STOOL (REPLACES STOOL CULTURE)

## 2020-11-16 DIAGNOSIS — J449 Chronic obstructive pulmonary disease, unspecified: Secondary | ICD-10-CM | POA: Diagnosis not present

## 2020-11-16 DIAGNOSIS — R197 Diarrhea, unspecified: Secondary | ICD-10-CM | POA: Diagnosis not present

## 2020-11-30 DIAGNOSIS — J449 Chronic obstructive pulmonary disease, unspecified: Secondary | ICD-10-CM | POA: Diagnosis not present

## 2020-11-30 DIAGNOSIS — R197 Diarrhea, unspecified: Secondary | ICD-10-CM | POA: Diagnosis not present

## 2020-11-30 DIAGNOSIS — R42 Dizziness and giddiness: Secondary | ICD-10-CM | POA: Diagnosis not present

## 2020-11-30 DIAGNOSIS — E871 Hypo-osmolality and hyponatremia: Secondary | ICD-10-CM | POA: Diagnosis not present

## 2021-01-11 ENCOUNTER — Ambulatory Visit: Payer: Medicare (Managed Care) | Admitting: Cardiology

## 2021-02-14 ENCOUNTER — Ambulatory Visit
Admission: RE | Admit: 2021-02-14 | Discharge: 2021-02-14 | Disposition: A | Discharge status: Home / Self Care | Payer: Medicare (Managed Care) | Source: Ambulatory Visit | Attending: Family Medicine | Admitting: Family Medicine

## 2021-02-14 ENCOUNTER — Other Ambulatory Visit: Payer: Self-pay | Admitting: Family Medicine

## 2021-02-14 DIAGNOSIS — M25561 Pain in right knee: Secondary | ICD-10-CM

## 2021-03-29 ENCOUNTER — Ambulatory Visit: Payer: Medicare (Managed Care) | Admitting: Cardiology

## 2021-04-16 DIAGNOSIS — J449 Chronic obstructive pulmonary disease, unspecified: Secondary | ICD-10-CM | POA: Diagnosis not present

## 2021-04-16 DIAGNOSIS — I10 Essential (primary) hypertension: Secondary | ICD-10-CM | POA: Diagnosis not present

## 2021-04-16 DIAGNOSIS — M79604 Pain in right leg: Secondary | ICD-10-CM | POA: Diagnosis not present

## 2021-04-16 DIAGNOSIS — Z720 Tobacco use: Secondary | ICD-10-CM | POA: Diagnosis not present

## 2021-04-29 DIAGNOSIS — M546 Pain in thoracic spine: Secondary | ICD-10-CM | POA: Diagnosis not present

## 2021-04-29 DIAGNOSIS — M1711 Unilateral primary osteoarthritis, right knee: Secondary | ICD-10-CM | POA: Diagnosis not present

## 2021-04-29 DIAGNOSIS — M1712 Unilateral primary osteoarthritis, left knee: Secondary | ICD-10-CM | POA: Diagnosis not present

## 2021-05-15 DIAGNOSIS — H905 Unspecified sensorineural hearing loss: Secondary | ICD-10-CM | POA: Diagnosis not present

## 2021-05-21 DIAGNOSIS — R319 Hematuria, unspecified: Secondary | ICD-10-CM | POA: Diagnosis not present

## 2021-05-21 DIAGNOSIS — J449 Chronic obstructive pulmonary disease, unspecified: Secondary | ICD-10-CM | POA: Diagnosis not present

## 2021-05-21 DIAGNOSIS — I10 Essential (primary) hypertension: Secondary | ICD-10-CM | POA: Diagnosis not present

## 2021-05-21 DIAGNOSIS — R7309 Other abnormal glucose: Secondary | ICD-10-CM | POA: Diagnosis not present

## 2021-06-10 DIAGNOSIS — M1711 Unilateral primary osteoarthritis, right knee: Secondary | ICD-10-CM | POA: Diagnosis not present

## 2021-06-10 DIAGNOSIS — M1712 Unilateral primary osteoarthritis, left knee: Secondary | ICD-10-CM | POA: Diagnosis not present

## 2021-06-10 DIAGNOSIS — M25512 Pain in left shoulder: Secondary | ICD-10-CM | POA: Diagnosis not present

## 2021-06-18 ENCOUNTER — Ambulatory Visit (INDEPENDENT_AMBULATORY_CARE_PROVIDER_SITE_OTHER): Payer: Medicare Other | Admitting: Cardiology

## 2021-06-18 ENCOUNTER — Encounter: Payer: Self-pay | Admitting: Cardiology

## 2021-06-18 ENCOUNTER — Other Ambulatory Visit: Payer: Self-pay

## 2021-06-18 VITALS — BP 129/56 | HR 64 | Ht 71.0 in | Wt 139.6 lb

## 2021-06-18 DIAGNOSIS — I10 Essential (primary) hypertension: Secondary | ICD-10-CM

## 2021-06-18 DIAGNOSIS — I359 Nonrheumatic aortic valve disorder, unspecified: Secondary | ICD-10-CM

## 2021-06-18 DIAGNOSIS — R0989 Other specified symptoms and signs involving the circulatory and respiratory systems: Secondary | ICD-10-CM

## 2021-06-18 DIAGNOSIS — I480 Paroxysmal atrial fibrillation: Secondary | ICD-10-CM | POA: Diagnosis not present

## 2021-06-18 MED ORDER — CHLORTHALIDONE 25 MG PO TABS: 25.0000 mg | ORAL_TABLET | Freq: Every day | ORAL | 3 refills | Status: DC

## 2021-06-18 NOTE — Progress Notes (Signed)
Cardiology Office Note    Date:  06/18/2021   ID:  Jacob Christian, DOB 03/24/1941, MRN [id number]  PCP:  Aurea Graff.Marlou Sa, MD  Cardiologist:  Fransico Him, MD   Chief Complaint  Patient presents with   Aortic Stenosis   Hypertension   Atrial Fibrillation    History of Present Illness:  Jacob Christian is a 81 y.o. male with a hx of moderate aortic stenosis, HTN, PVD, COPD and anxiety with panic attacks.  He also has a history of paroxysmal atrial fibrillation and flutter followed by Dr. Lovena Le with EP.  He was last seen by Dr. Lovena Le last June and was doing well.  His last echo was done 10/04/2020 showing normal LV function with EF 55 to 60% with moderate LVH, severe LA enlargement, degenerative mitral valve with trivial MR and possible a possible ruptured chordae tendinae.  The aortic valve was calcified with moderate aortic valve stenosis with a mean aortic valve gradient of 21 mmHg.  The ascending aorta was mildly dilated at 40 mmHg.  He is here today for followup and is doing well.  He denies any chest pain or pressure, PND, orthopnea, LE edema, dizziness, or syncope. He has chronic DOE related to COPD and controlled on inhalers. He continues to smoke but is now trying a vap pipe to get off cigarettes. He recently has been having palpitations. He has cut out caffeine and sugar and seems to have settled down. He had been on a diuretic but stopped taking it because he thought he urinated too much.  His PCP recently added amlodipine despite being on Cardizem for BP controlHe is compliant with his meds and is tolerating meds with no SE.     Past Medical History:  Diagnosis Date   Anxiety    Aortic stenosis    Arthritis    COPD (chronic obstructive pulmonary disease) ([place])    "borderline"   Dermatitis    GERD (gastroesophageal reflux disease)    Hearing loss    Heart murmur    "leaky valve" being monitored   Hypertension    Leg lesion    Peripheral vascular disease (H[facility])     Retroperitoneal mass LUQ s/p lap-assisted resection & left adrenalectomy 05/12/2014 05/12/2014   Sciatica    "occasional"-right side    Past Surgical History:  Procedure Laterality Date   LAPAROSCOPIC ADRENALECTOMY Left 05/12/2014   Procedure: LAPAROSCOPIC LEFT ADRENALECTOMY;  Surgeon: Michael Boston, MD;  Location: [facility];  Service: General;  Laterality: Left;   Leg fracture repair Right 1996   metal plate in place, with bone graft    Current Medications: Current Meds  Medication Sig   acetaminophen (TYLENOL) 500 MG tablet Take 1,000 mg by mouth every 6 (six) hours as needed for mild pain or fever.   ALPRAZolam (XANAX) 1 MG tablet Take 1 mg by mouth 3 (three) times daily as needed for anxiety.   apixaban (ELIQUIS) 5 MG TABS tablet Take 1 tablet (5 mg total) by mouth 2 (two) times daily.   Azelastine HCl 137 MCG/SPRAY SOLN Place 1 spray into the nose daily as needed (allergies).   diltiazem (CARDIZEM CD) 180 MG 24 hr capsule TAKE 1 CAPSULE BY MOUTH EVERY DAY (Patient taking differently: Take 180 mg by mouth daily.)   fluticasone (FLONASE) 50 MCG/ACT nasal spray Place 1 spray into both nostrils at bedtime.   lisinopril (PRINIVIL,ZESTRIL) 40 MG tablet Take 40 mg by mouth daily.   loratadine (CLARITIN) 10 MG tablet Take 10  mg by mouth daily.   Multiple Vitamin (ONE-A-DAY MENS PO) Take by mouth daily. Centrum silver   Oxycodone HCl 10 MG TABS Take 10 mg by mouth 3 (three) times daily as needed (pain).   polyvinyl alcohol (LIQUIFILM TEARS) 1.4 % ophthalmic solution Place 1 drop into both eyes 3 (three) times daily as needed for dry eyes.   vitamin C (ASCORBIC ACID) 500 MG tablet Take 500 mg by mouth 2 (two) times a week.   VITAMIN D-VITAMIN K PO Take 1 tablet by mouth 2 (two) times a week.   vitamin E 180 MG (400 UNITS) capsule Take 400 Units by mouth 2 (two) times a week.   [DISCONTINUED] predniSONE (STERAPRED UNI-PAK 21 TAB) 10 MG (21) TBPK tablet Take by mouth daily. Take 6 tabs by mouth  daily  for 2 days, then 5 tabs for 2 days, then 4 tabs for 2 days, then 3 tabs for 2 days, 2 tabs for 2 days, then 1 tab by mouth daily for 2 days   [DISCONTINUED] Pyridoxine HCl (VITAMIN B-6 PO) Take 1 tablet by mouth 2 (two) times a week.   [DISCONTINUED] vitamin B-12 (CYANOCOBALAMIN) 1000 MCG tablet Take 1,000 mcg by mouth 2 (two) times a week.    Allergies:   Citalopram hydrobromide, Codeine, Dilaudid [hydromorphone hcl], Tramadol, and Vicodin [hydrocodone-acetaminophen]   Social History   Socioeconomic History   Marital status: Divorced    Spouse name: Not on file   Number of children: Not on file   Years of education: Not on file   Highest education level: Not on file  Occupational History   Not on file  Tobacco Use   Smoking status: Former    Packs/day: 0.50    Years: 50.00    Pack years: 25.00    Types: Cigarettes    Quit date: 2010    Years since quitting: 13.1   Smokeless tobacco: Never  Substance and Sexual Activity   Alcohol use: Yes    Alcohol/week: 2.0 - 3.0 standard drinks    Types: 2 - 3 Glasses of wine per week   Drug use: Yes    Types: Marijuana    Comment: once daily   Sexual activity: Not on file  Other Topics Concern   Not on file  Social History Narrative   Not on file   Social Determinants of Health   Financial Resource Strain: Not on file  Food Insecurity: Not on file  Transportation Needs: Not on file  Physical Activity: Not on file  Stress: Not on file  Social Connections: Not on file     Family History:  The patient's family history includes CVA in his brother and father; Colon cancer in his brother; Hypertension in his father; Pancreatic cancer in his maternal uncle; Throat cancer in his maternal grandmother and mother.   ROS:   Please see the history of present illness.    ROS All other systems reviewed and are negative.  No flowsheet data found.     PHYSICAL EXAM:   VS:  BP (!) 129/56    Pulse 64    Ht '5\' 11"'$  (1.803 m)    Wt  139 lb 9.6 oz (63.3 kg)    SpO2 96%    BMI 19.47 kg/m    GEN: Well nourished, well developed in no acute distress HEENT: Normal NECK: No JVD; bilateral carotid bruits LYMPHATICS: No lymphadenopathy CARDIAC:RRR, no rubs, gallops 2/6 systolic murmur at the right upper sternal border RESPIRATORY:  Clear to  auscultation without rales, wheezing or rhonchi  ABDOMEN: Soft, non-tender, non-distended MUSCULOSKELETAL:  No edema; No deformity  SKIN: Warm and dry NEUROLOGIC:  Alert and oriented x 3 PSYCHIATRIC:  Normal affect   Wt Readings from Last 3 Encounters:  06/18/21 139 lb 9.6 oz (63.3 kg)  10/04/20 158 lb 3.2 oz (71.8 kg)  06/26/20 168 lb 9.6 oz (76.5 kg)      Studies/Labs Reviewed:   EKG:  EKG is not ordered today.    Recent Labs: 11/14/2020: ALT 11; BUN 7; Creatinine, Ser 0.74; Hemoglobin 13.9; Platelets 355; Potassium 3.3; Sodium 132   Lipid Panel No results found for: CHOL, TRIG, HDL, CHOLHDL, VLDL, LDLCALC, LDLDIRECT  Additional studies/ records that were reviewed today include:  Office notes from PCP and ENT    ASSESSMENT:    1. Aortic valve disorder   2. Benign essential HTN   3. Paroxysmal atrial fibrillation (HCC)   4. Bilateral carotid bruits      PLAN:  In order of problems listed above:  Aortic stenosis -2D echo 10/04/2020 showed moderate aortic stenosis with mean aortic valve gradient 21 mmHg -he is asymptomatic -continue yearly echo exams and we will get a repeat echo 09/2021 to make sure his AAS is stable  HTN -His BP is controlled on exam today -he has a lot of knee pain and has gotten several steroid shots which likely contribute to elevated BP -he had been on a diuretic but stopped taking it because he thought he urinated too much.  -his PCP recently added amlodipine despite being on Cardizem for BP control -I am going to start chlorthalidone '25mg'$  daily and stop amlodipine -check BMET in 1 week and folloupw in HTN clinic with Pharm D in 2  weeks -Continue prescription drug management with Cardizem CD 180 mg daily, lisinopril 40 mg daily with as needed refills  Paroxysmal atrial fibrillation/flutter -This is followed by EP -He is maintaining normal sinus rhythm on exam today but has had some problems with breakthrough PAF and has cut out caffeine and sugar and things have settled down this week -He denies any bleeding problems on Eliquis -Continue Cardizem CD 180 mg daily and Eliquis 5 mg twice daily with as needed refills -I have personally reviewed and interpreted outside labs performed by patient's PCP which showed serum creatinine 0.85 potassium 4.1, hemoglobin 12.7 on 11/30/2020  Carotid bruits -bilateral -will get carotid dopplers to assess   Medication Adjustments/Labs and Tests Ordered: Current medicines are reviewed at length with the patient today.  Concerns regarding medicines are outlined above.  Medication changes, Labs and Tests ordered today are listed in the Patient Instructions below.  There are no Patient Instructions on file for this visit.   Signed, Fransico Him, MD  06/18/2021 1:18 PM    [facility], [place], [place]  [address] Phone: [phone]; Fax: [phone]

## 2021-06-18 NOTE — Patient Instructions (Signed)
Medication Instructions:  ?Your physician has recommended you make the following change in your medication: 1) STOP taking amlodipine  ?2) START chlorthalidone 25  daily  ? ?*If you need a refill on your cardiac medications before your next appointment, please call your pharmacy* ? ? ?Lab Work: ?IN ONE WEEK: BMET ?If you have labs (blood work) drawn today and your tests are completely normal, you will receive your results only by: ?MyChart Message (if you have MyChart) OR ?A paper copy in the mail ?If you have any lab test that is abnormal or we need to change your treatment, we will call you to review the results. ? ? ?Testing/Procedures: ?Your physician has requested that you have an echocardiogram in June 2023. Echocardiography is a painless test that uses sound waves to create images of your heart. It provides your doctor with information about the size and shape of your heart and how well your heart?s chambers and valves are working. This procedure takes approximately one hour. There are no restrictions for this procedure. ? ?Your physician has requested that you have a carotid duplex. This test is an ultrasound of the carotid arteries in your neck. It looks at blood flow through these arteries that supply the brain with blood. Allow one hour for this exam. There are no restrictions or special instructions. ? ?Follow-Up: ?At [facility], you and your health needs are our priority.  As part of our continuing mission to provide you with exceptional heart care, we have created designated Provider Care Teams.  These Care Teams include your primary Cardiologist (physician) and Advanced Practice Providers (APPs -  Physician Assistants and Nurse Practitioners) who all work together to provide you with the care you need, when you need it. ? ? ?Your next appointment:   ?1 year(s) ? ?The format for your next appointment:   ?In Person ? ?Provider:   ?Fransico Him, MD   ? ? ?Other Instructions ?You have been referred to  see PharmD in the Hypertension Clinic in 2 weeks.   ?

## 2021-06-18 NOTE — Addendum Note (Signed)
Addended by: Antonieta Iba on: 06/18/2021 01:23 PM ? ? Modules accepted: Orders ? ?

## 2021-06-19 DIAGNOSIS — L720 Epidermal cyst: Secondary | ICD-10-CM | POA: Diagnosis not present

## 2021-06-19 DIAGNOSIS — I10 Essential (primary) hypertension: Secondary | ICD-10-CM | POA: Diagnosis not present

## 2021-06-19 DIAGNOSIS — R319 Hematuria, unspecified: Secondary | ICD-10-CM | POA: Diagnosis not present

## 2021-06-19 DIAGNOSIS — M150 Primary generalized (osteo)arthritis: Secondary | ICD-10-CM | POA: Diagnosis not present

## 2021-06-26 ENCOUNTER — Other Ambulatory Visit: Payer: Self-pay

## 2021-06-26 ENCOUNTER — Other Ambulatory Visit: Payer: Self-pay | Admitting: Cardiology

## 2021-06-26 ENCOUNTER — Other Ambulatory Visit: Payer: Medicare Other | Admitting: *Deleted

## 2021-06-26 DIAGNOSIS — I480 Paroxysmal atrial fibrillation: Secondary | ICD-10-CM

## 2021-06-26 DIAGNOSIS — I359 Nonrheumatic aortic valve disorder, unspecified: Secondary | ICD-10-CM | POA: Diagnosis not present

## 2021-06-26 DIAGNOSIS — I10 Essential (primary) hypertension: Secondary | ICD-10-CM

## 2021-06-26 DIAGNOSIS — R0989 Other specified symptoms and signs involving the circulatory and respiratory systems: Secondary | ICD-10-CM | POA: Diagnosis not present

## 2021-06-27 ENCOUNTER — Telehealth: Payer: Self-pay | Admitting: Cardiology

## 2021-06-27 ENCOUNTER — Telehealth: Payer: Self-pay

## 2021-06-27 LAB — BASIC METABOLIC PANEL
BUN/Creatinine Ratio: 23 (ref 10–24)
BUN: 27 mg/dL (ref 8–27)
CO2: 23 mmol/L (ref 20–29)
Calcium: 9.9 mg/dL (ref 8.6–10.2)
Chloride: 92 mmol/L — ABNORMAL LOW (ref 96–106)
Creatinine, Ser: 1.18 mg/dL (ref 0.76–1.27)
Glucose: 88 mg/dL (ref 70–99)
Potassium: 5 mmol/L (ref 3.5–5.2)
Sodium: 127 mmol/L — ABNORMAL LOW (ref 134–144)
eGFR: 63 mL/min/{1.73_m2} (ref >59)

## 2021-06-27 MED ORDER — DILTIAZEM HCL ER COATED BEADS 240 MG PO CP24: 240.0000 mg | ORAL_CAPSULE | Freq: Every day | ORAL | 3 refills | Status: AC

## 2021-06-27 NOTE — Telephone Encounter (Signed)
The patient's daughter has been notified of the result and verbalized understanding.  All questions (if any) were answered. ?Antonieta Iba, RN 06/27/2021 10:43 AM  ? ?

## 2021-06-27 NOTE — Telephone Encounter (Signed)
Spoke with the patient's daughter and advised that they do not make cardizem CD in 60 mg tablets. She is aware that she will need to pick up the 240 mg tablets for him to take daily.  ?

## 2021-06-27 NOTE — Telephone Encounter (Signed)
? ?  Pt c/o medication issue: ? ?1. Name of Medication: diltiazem ? ?2. How are you currently taking this medication (dosage and times per day)?  ? ?3. Are you having a reaction (difficulty breathing--STAT)?  ? ?4. What is your medication issue? Pt's wife calling, she said Dr. Radford Pax increased diltiazem to 240 mg, however, they still have the 180 mg she is asking if Dr. Radford Pax can call in 60 mg for 2 months so that pt can still use the 180 mg then after its finish can order the 240 mg again ?

## 2021-06-27 NOTE — Telephone Encounter (Signed)
-----   Message from Sueanne Margarita, MD sent at 06/27/2021 10:12 AM EDT ----- ?Na dropped after addition of chlorthalidone - stop chlorthalidone and increase Cardizem to '240mg'$  daily and check HR and Bp daily for a week and call with results ?

## 2021-06-28 DIAGNOSIS — M1711 Unilateral primary osteoarthritis, right knee: Secondary | ICD-10-CM | POA: Diagnosis not present

## 2021-06-28 DIAGNOSIS — M1712 Unilateral primary osteoarthritis, left knee: Secondary | ICD-10-CM | POA: Diagnosis not present

## 2021-07-01 DIAGNOSIS — R350 Frequency of micturition: Secondary | ICD-10-CM | POA: Diagnosis not present

## 2021-07-01 DIAGNOSIS — R3121 Asymptomatic microscopic hematuria: Secondary | ICD-10-CM | POA: Diagnosis not present

## 2021-07-01 DIAGNOSIS — R3912 Poor urinary stream: Secondary | ICD-10-CM | POA: Diagnosis not present

## 2021-07-05 DIAGNOSIS — M1711 Unilateral primary osteoarthritis, right knee: Secondary | ICD-10-CM | POA: Diagnosis not present

## 2021-07-05 DIAGNOSIS — M170 Bilateral primary osteoarthritis of knee: Secondary | ICD-10-CM | POA: Diagnosis not present

## 2021-07-05 DIAGNOSIS — M1712 Unilateral primary osteoarthritis, left knee: Secondary | ICD-10-CM | POA: Diagnosis not present

## 2021-07-09 ENCOUNTER — Encounter (HOSPITAL_COMMUNITY): Payer: Medicare Other

## 2021-07-10 ENCOUNTER — Ambulatory Visit: Payer: Medicare Other

## 2021-07-10 ENCOUNTER — Ambulatory Visit (HOSPITAL_COMMUNITY)
Admission: RE | Admit: 2021-07-10 | Discharge: 2021-07-10 | Disposition: A | Discharge status: Home / Self Care | Payer: Medicare Other | Source: Ambulatory Visit | Attending: Cardiology | Admitting: Cardiology

## 2021-07-10 DIAGNOSIS — R0989 Other specified symptoms and signs involving the circulatory and respiratory systems: Secondary | ICD-10-CM | POA: Insufficient documentation

## 2021-07-12 ENCOUNTER — Telehealth: Payer: Self-pay

## 2021-07-12 ENCOUNTER — Encounter: Payer: Self-pay | Admitting: Cardiology

## 2021-07-12 DIAGNOSIS — I771 Stricture of artery: Secondary | ICD-10-CM

## 2021-07-12 DIAGNOSIS — I6529 Occlusion and stenosis of unspecified carotid artery: Secondary | ICD-10-CM | POA: Insufficient documentation

## 2021-07-12 DIAGNOSIS — M1712 Unilateral primary osteoarthritis, left knee: Secondary | ICD-10-CM | POA: Diagnosis not present

## 2021-07-12 DIAGNOSIS — M1711 Unilateral primary osteoarthritis, right knee: Secondary | ICD-10-CM | POA: Diagnosis not present

## 2021-07-12 NOTE — Telephone Encounter (Signed)
Upper extremity arterial dopplers have been ordered.  ?

## 2021-07-12 NOTE — Telephone Encounter (Signed)
-----   Message from Sueanne Margarita, MD sent at 07/12/2021 10:48 AM EDT ----- ?Please get bilateral upper extremity arterial dopplers ?----- Message ----- ?From: Antonieta Iba, RN ?Sent: 07/12/2021  10:33 AM EDT ?To: Sueanne Margarita, MD ? ?The patient's daughter has been notified of the result and verbalized understanding.  All questions (if any) were answered. ?She states that she has noticed that the patient has some weakness and has been dropping things more frequently. She is not sure if it is more so on his right side. She also states that he shakes his hands frequently as if they are numb.  ? ? ? ?

## 2021-07-15 DIAGNOSIS — N200 Calculus of kidney: Secondary | ICD-10-CM | POA: Diagnosis not present

## 2021-07-15 DIAGNOSIS — R319 Hematuria, unspecified: Secondary | ICD-10-CM | POA: Diagnosis not present

## 2021-07-15 DIAGNOSIS — I700 Atherosclerosis of aorta: Secondary | ICD-10-CM | POA: Diagnosis not present

## 2021-07-15 DIAGNOSIS — N2889 Other specified disorders of kidney and ureter: Secondary | ICD-10-CM | POA: Diagnosis not present

## 2021-07-15 DIAGNOSIS — R3121 Asymptomatic microscopic hematuria: Secondary | ICD-10-CM | POA: Diagnosis not present

## 2021-07-19 ENCOUNTER — Other Ambulatory Visit (HOSPITAL_COMMUNITY): Payer: Self-pay | Admitting: Cardiology

## 2021-07-19 DIAGNOSIS — G458 Other transient cerebral ischemic attacks and related syndromes: Secondary | ICD-10-CM

## 2021-07-24 DIAGNOSIS — R3121 Asymptomatic microscopic hematuria: Secondary | ICD-10-CM | POA: Diagnosis not present

## 2021-07-25 ENCOUNTER — Telehealth: Payer: Self-pay | Admitting: *Deleted

## 2021-07-25 NOTE — Telephone Encounter (Signed)
? ?  Pre-operative Risk Assessment  ?  ?Patient Name: Jacob Christian  ?DOB: 02-21-1941 ?MRN: [id number]  ? ?  ? ?Request for Surgical Clearance   ? ?Procedure:   PROSTATE ULTRASOUND BIOPSY ? ?Date of Surgery:  Clearance 08/30/21                              ?   ?Surgeon:  DR. MARYELLEN PACE ?Surgeon's Group or Practice Name:  [facility] ?Phone number:  [phone] ?Fax number:  [phone] ?  ?Type of Clearance Requested:   ?- Pharmacy:  Hold Apixaban (Eliquis) X 3 DAYS ?  ?Type of Anesthesia:  Not Indicated ?  ?Additional requests/questions:   ? ?Signed, ?Keaten Mashek Avanell Shackleton   ?07/25/2021, 2:46 PM   ?

## 2021-07-29 ENCOUNTER — Telehealth: Payer: Self-pay

## 2021-07-29 NOTE — Telephone Encounter (Signed)
? ? ?  Name: Jacob Christian  ?DOB: 02-01-1941  ?MRN: [id number] ? ?Primary Cardiologist: Fransico Him, MD ? ?Last OV reviewed 06/18/21, required med adjustment for HTN at that time, also comments of breakthrough AF. Therefore would be best served to ensure no new issues prior to procedure. Preoperative team, please contact this patient and set up a phone call appointment for further preoperative risk assessment. Please obtain consent and complete medication review. Thank you for your help. ? ? ?Charlie Pitter, PA-C ?07/29/2021, 9:50 AM ?[phone] ?[facility] ?[address] ?[place], [place] [address] ? ? ?

## 2021-07-29 NOTE — Telephone Encounter (Signed)
Spoke with patients daughter, Maudie Mercury, per DPR, telehealth visit scheduled; 08/06/2021; daughter voiced understanding.  ?

## 2021-07-29 NOTE — Telephone Encounter (Signed)
Patient with diagnosis of atrial fibrillation on Eliquis for anticoagulation.   ? ?Procedure: prostate ultrasound biopsy ?Date of procedure: 08/30/21 ? ? ?CHA2DS2-VASc Score = 4  ? This indicates a 4.8% annual risk of stroke. ?The patient's score is based upon: ?CHF History: 0 ?HTN History: 1 ?Diabetes History: 0 ?Stroke History: 0 ?Vascular Disease History: 1 ?Age Score: 2 ?Gender Score: 0 ? ?CrCl 46 ?Platelet count 355 ? ?Per office protocol, patient can hold eliquis for 3 days prior to procedure.   ?Patient will not need bridging with Lovenox (enoxaparin) around procedure. ? ? ? ?

## 2021-07-29 NOTE — Telephone Encounter (Signed)
?Patient Consent for Virtual Visit  ? ? ?[name], you are scheduled for a virtual visit with your provider 08/06/2021.  Just as we do with appointments in the office, we must obtain your consent to participate.  Your consent will be active for this visit and any virtual visit you may have with one of our providers in the next 365 days.  If you have a MyChart account, I can also send a copy of this consent to you electronically.  All virtual visits are billed to your insurance company just like a traditional visit in the office.  As this is a virtual visit, video technology does not allow for your provider to perform a traditional examination.  This may limit your provider's ability to fully assess your condition.  If your provider identifies any concerns that need to be evaluated in person or the need to arrange testing such as labs, EKG, etc, we will make arrangements to do so.  Although advances in technology are sophisticated, we cannot ensure that it will always work on either your end or our end.  If the connection with a video visit is poor, we may have to switch to a telephone visit.  With either a video or telephone visit, we are not always able to ensure that we have a secure connection.   I need to obtain your verbal consent now.   Are you willing to proceed with your visit on 08/06/2021?  ? ? ? ?FINCH COSTANZO has provided verbal consent on 07/29/2021 for a virtual visit (video or telephone). ? ? ?CONSENT FOR VIRTUAL VISIT FOR:  Jacob Christian  ?By participating in this virtual visit I agree to the following: ? ?I hereby voluntarily request, consent and authorize [facility] and its employed or contracted physicians, physician assistants, nurse practitioners or other licensed health care professionals (the Practitioner), to provide me with telemedicine health care services (the ?Services") as deemed necessary by the treating Practitioner. I acknowledge and consent to receive the  Services by the Practitioner via telemedicine. I understand that the telemedicine visit will involve communicating with the Practitioner through live audiovisual communication technology and the disclosure of certain medical information by electronic transmission. I acknowledge that I have been given the opportunity to request an in-person assessment or other available alternative prior to the telemedicine visit and am voluntarily participating in the telemedicine visit. ? ?I understand that I have the right to withhold or withdraw my consent to the use of telemedicine in the course of my care at any time, without affecting my right to future care or treatment, and that the Practitioner or I may terminate the telemedicine visit at any time. I understand that I have the right to inspect all information obtained and/or recorded in the course of the telemedicine visit and may receive copies of available information for a reasonable fee.  I understand that some of the potential risks of receiving the Services via telemedicine include:  ?Delay or interruption in medical evaluation due to technological equipment failure or disruption; ?Information transmitted may not be sufficient (e.g. poor resolution of images) to allow for appropriate medical decision making by the Practitioner; and/or  ?In rare instances, security protocols could fail, causing a breach of personal health information. ? ?Furthermore, I acknowledge that it is my responsibility to provide information about my medical history, conditions and care that is complete and accurate to the best of my ability. I acknowledge that Practitioner's advice, recommendations, and/or decision may be based  on factors not within their control, such as incomplete or inaccurate data provided by me or distortions of diagnostic images or specimens that may result from electronic transmissions. I understand that the practice of medicine is not an exact science and that  Practitioner makes no warranties or guarantees regarding treatment outcomes. I acknowledge that a copy of this consent can be made available to me via my patient portal ([facility]), or I can request a printed copy by calling the office of [facility].   ? ?I understand that my insurance will be billed for this visit.  ? ?I have read or had this consent read to me. ?I understand the contents of this consent, which adequately explains the benefits and risks of the Services being provided via telemedicine.  ?I have been provided ample opportunity to ask questions regarding this consent and the Services and have had my questions answered to my satisfaction. ?I give my informed consent for the services to be provided through the use of telemedicine in my medical care ? ? ? ?

## 2021-07-30 NOTE — Progress Notes (Signed)
Patient ID: BUSH MURDOCH                 DOB: 1940/06/15                      MRN: [id number] ? ? ? ? ?HPI: ?Jacob Christian is a 81 y.o. male referred by Dr. Radford Pax to HTN clinic. PMH is significant for moderate aortic stenosis, HTN, PVD, Afib/flutter, COPD, and anxiety with panic attacks. Echo 10/04/2020 showed normal LV function with EF 55 to 60% with moderate LVH, severe LA enlargement, degenerative mitral valve with trivial MR and possible a possible ruptured chordae tendinae. The aortic valve was calcified with moderate aortic valve stenosis. His PCP started him on amlodipine however this was discontinued by Dr Radford Pax 06/18/21 as pt also takes diltiazem for his afib. He was switched to chlorthalidone 25 mg daily and referred him to HTN clinic. BMET checked on 06/26/21 showed drop in Na (132 to 127) so chlorthalidone was stopped and diltiazem was increased to 240 mg daily with instructions to monitor HR and BP. ? ?Pt presents today with his daughter for HTN clinic appointment. Pt did stop his chlorthalidone and is taking the increased dose of diltiazem as prescribed. He reports med compliance and took his meds this morning prior to the visit. Reports some dizziness/lightheadedness when he gets up in the morning, but it resolves after a few minutes. No other dizziness throughout the day. Denies blurred vision, HA, and swelling. Pt currently taking Eliquis as prescribed twice daily. Inquires about hold time before upcoming biopsy. Already cleared - ok to hold 3 days prior.  Pt has stopped taking naproxen and switched to Tylenol. Currently taking about 3g of Tylenol daily in addition to oxycodone. Feels that naproxen worked better in the past - discussed increased bleed risk while on Eliquis as well as increased CV/HTN/CKD risk. ? ?Home BP readings: 120-130s/60-70s  ? ?Current HTN meds: diltiazem 240 mg daily, lisinopril 40 mg daily ?Previously tried: amlodipine (stopped d/t him being on diltiazem for Afib),  chlorthalidone 25 mg (stopped d/t low Na) ? ?BP goal: <130/80 ? ?Family History: Brother - CVA. Father - CVA, HTN ? ?Social History: Still smokes - trying out a vape pipe to get off cigarettes. Reports 2-3 glasses of wine per week and marijuana use. ? ?Diet: Cut out caffeine and sugar. Drinks caffeine-free soda  ? ?Wt Readings from Last 3 Encounters:  ?06/18/21 139 lb 9.6 oz (63.3 kg)  ?10/04/20 158 lb 3.2 oz (71.8 kg)  ?06/26/20 168 lb 9.6 oz (76.5 kg)  ? ?BP Readings from Last 3 Encounters:  ?06/18/21 (!) 129/56  ?11/14/20 (!) 159/95  ?10/04/20 116/70  ? ?Pulse Readings from Last 3 Encounters:  ?06/18/21 64  ?11/14/20 61  ?10/04/20 62  ? ? ?Renal function: ?CrCl cannot be calculated (Patient's most recent lab result is older than the maximum 21 days allowed.). ? ?Past Medical History:  ?Diagnosis Date  ? Anxiety   ? Aortic stenosis   ? Arthritis   ? Carotid artery stenosis   ? 1 to 39% carotid artery stenosis by Dopplers.  Right subclavian artery stenosis and resistive flow in the left vertebral artery.  ? COPD (chronic obstructive pulmonary disease) ([place])   ? "borderline"  ? Dermatitis   ? GERD (gastroesophageal reflux disease)   ? Hearing loss   ? Heart murmur   ? "leaky valve" being monitored  ? Hypertension   ? Leg lesion   ? Peripheral  vascular disease ([place])   ? 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 Sciatica   ? "occasional"-right side  ? ? ?Current Outpatient Medications on File Prior to Visit  ?Medication Sig Dispense Refill  ? acetaminophen (TYLENOL) 500 MG tablet Take 1,000 mg by mouth every 6 (six) hours as needed for mild pain or fever.    ? albuterol (VENTOLIN HFA) 108 (90 Base) MCG/ACT inhaler Inhale 1 puff into the lungs every 4 (four) hours as needed.    ? ALPRAZolam (XANAX) 1 MG tablet Take 1 mg by mouth 3 (three) times daily as needed for anxiety.    ? apixaban (ELIQUIS) 5 MG TABS tablet Take 1 tablet (5 mg total) by mouth 2 (two) times  daily. (Patient taking differently: Take 5 mg by mouth daily.) 60 tablet 11  ? Azelastine HCl 137 MCG/SPRAY SOLN Place 1 spray into the nose daily as needed (allergies).    ? BREZTRI AEROSPHERE 160-9-4.8 MCG/ACT AERO Inhale 2 puffs into the lungs 2 (two) times daily.    ? diltiazem (CARDIZEM CD) 240 MG 24 hr capsule Take 1 capsule (240 mg total) by mouth daily. 90 capsule 3  ? fluticasone (FLONASE) 50 MCG/ACT nasal spray Place 1 spray into both nostrils at bedtime.    ? furosemide (LASIX) 20 MG tablet Take 20 mg by mouth daily.    ? lisinopril (PRINIVIL,ZESTRIL) 40 MG tablet Take 40 mg by mouth daily.    ? loratadine (CLARITIN) 10 MG tablet Take 10 mg by mouth daily.    ? Multiple Vitamin (ONE-A-DAY MENS PO) Take 1 tablet by mouth every other day. Centrum silver    ? Oxycodone HCl 10 MG TABS Take 10 mg by mouth 3 (three) times daily as needed (pain).    ? pantoprazole (PROTONIX) 40 MG tablet Take 40 mg by mouth daily.    ? polyvinyl alcohol (LIQUIFILM TEARS) 1.4 % ophthalmic solution Place 1 drop into both eyes 3 (three) times daily as needed for dry eyes.    ? tamsulosin (FLOMAX) 0.4 MG CAPS capsule Take 0.4 mg by mouth at bedtime.    ? vitamin C (ASCORBIC ACID) 500 MG tablet Take 500 mg by mouth 2 (two) times a week.    ? VITAMIN D-VITAMIN K PO Take 1 tablet by mouth 2 (two) times a week.    ? vitamin E 180 MG (400 UNITS) capsule Take 400 Units by mouth 2 (two) times a week.    ? ?No current facility-administered medications on file prior to visit.  ? ? ?Allergies  ?Allergen Reactions  ? Citalopram Hydrobromide   ?  Pt did not like the way the pill made him feel " just didn't care about anything"  ? Codeine Itching  ?  PT denies Codeine allergy  ? Dilaudid [Hydromorphone Hcl]   ? Tramadol   ? Vicodin [Hydrocodone-Acetaminophen]   ? ?Assessment/Plan: ? ?1. Hypertension - BP 110/56 is at goal <130/45mHg. Continue diltiazem 240 mg daily and lisinopril 40 mg daily. F/u with PharmD as needed. Avoidance of NSAIDs has  been discussed with pt as he also takes Eliquis. Provided him with Eliquis hold instructions prior to upcoming procedure per 4/13 clearance note. ? ? ?Patient seen with SDebria Garret PEunolapharmacy student ? ?Megan E. Supple, PharmD, BCACP, CPP ?[id number] [place] [address]?Phone: [phone] Fax: [phone]?07/31/2021 3:25 PM ? ?

## 2021-07-31 ENCOUNTER — Ambulatory Visit (HOSPITAL_COMMUNITY)
Admission: RE | Admit: 2021-07-31 | Discharge: 2021-07-31 | Disposition: A | Discharge status: Home / Self Care | Payer: Medicare Other | Source: Ambulatory Visit | Attending: Cardiovascular Disease | Admitting: Cardiovascular Disease

## 2021-07-31 ENCOUNTER — Ambulatory Visit (INDEPENDENT_AMBULATORY_CARE_PROVIDER_SITE_OTHER): Payer: Medicare Other | Admitting: Pharmacist

## 2021-07-31 VITALS — BP 110/56 | HR 90

## 2021-07-31 DIAGNOSIS — I771 Stricture of artery: Secondary | ICD-10-CM | POA: Diagnosis not present

## 2021-07-31 DIAGNOSIS — G458 Other transient cerebral ischemic attacks and related syndromes: Secondary | ICD-10-CM | POA: Diagnosis not present

## 2021-07-31 DIAGNOSIS — I10 Essential (primary) hypertension: Secondary | ICD-10-CM | POA: Diagnosis not present

## 2021-07-31 NOTE — Patient Instructions (Signed)
Your blood pressure goal is < 130/80.  ? ?Continue diltiazem 240 mg daily and lisinopril 40 mg daily. ? ?Continue taking Eliquis 5 mg twice daily. You can hold the Eliquis three days before your procedure. ? ? ? ?

## 2021-08-06 ENCOUNTER — Ambulatory Visit (INDEPENDENT_AMBULATORY_CARE_PROVIDER_SITE_OTHER): Payer: Medicare Other | Admitting: Physician Assistant

## 2021-08-06 DIAGNOSIS — H02831 Dermatochalasis of right upper eyelid: Secondary | ICD-10-CM | POA: Diagnosis not present

## 2021-08-06 DIAGNOSIS — I480 Paroxysmal atrial fibrillation: Secondary | ICD-10-CM | POA: Diagnosis not present

## 2021-08-06 DIAGNOSIS — Z01810 Encounter for preprocedural cardiovascular examination: Secondary | ICD-10-CM | POA: Diagnosis not present

## 2021-08-06 DIAGNOSIS — H25813 Combined forms of age-related cataract, bilateral: Secondary | ICD-10-CM | POA: Diagnosis not present

## 2021-08-06 DIAGNOSIS — H52203 Unspecified astigmatism, bilateral: Secondary | ICD-10-CM | POA: Diagnosis not present

## 2021-08-06 DIAGNOSIS — H02834 Dermatochalasis of left upper eyelid: Secondary | ICD-10-CM | POA: Diagnosis not present

## 2021-08-06 NOTE — Progress Notes (Signed)
? ?Virtual Visit via Telephone Note  ? ?This visit type was conducted due to national recommendations for restrictions regarding the COVID-19 Pandemic (e.g. social distancing) in an effort to limit this patient's exposure and mitigate transmission in our community.  Due to his co-morbid illnesses, this patient is at least at moderate risk for complications without adequate follow up.  This format is felt to be most appropriate for this patient at this time.  The patient did not have access to video technology/had technical difficulties with video requiring transitioning to audio format only (telephone).  All issues noted in this document were discussed and addressed.  No physical exam could be performed with this format.  Please refer to the patient's chart for his  consent to telehealth for [facility] ? ?Evaluation Performed:  Preoperative cardiovascular risk assessment ?_____________  ? ?Date:  08/06/2021  ? ?Patient ID:  Jacob, Christian 29-Nov-1940, MRN [id number] ?Patient Location:  ?Home ?Provider location:   ?Office ? ?Primary Care Provider:  Collene Leyden, MD ?Primary Cardiologist:  Fransico Him, MD ? ?Chief Complaint  ?  ?81 y.o. y/o male with a h/o PAF, moderate aortic stenosis, HTN, PVD, COPD and anxiety with panic attacks who is pending PROSTATE ULTRASOUND BIOPSY, and presents today for telephonic preoperative cardiovascular risk assessment. ? ?Past Medical History  ?  ?Past Medical History:  ?Diagnosis Date  ? Anxiety   ? Aortic stenosis   ? Arthritis   ? Carotid artery stenosis   ? 1 to 39% carotid artery stenosis by Dopplers.  Right subclavian artery stenosis and resistive flow in the left vertebral artery.  ? COPD (chronic obstructive pulmonary disease) ([place])   ? "borderline"  ? Dermatitis   ? GERD (gastroesophageal reflux disease)   ? Hearing loss   ? Heart murmur   ? "leaky valve" being monitored  ? Hypertension   ? Leg lesion   ? Peripheral vascular disease ([place])   ? Retroperitoneal mass  LUQ s/p lap-assisted resection & left adrenalectomy 05/12/2014 05/12/2014  ? Sciatica   ? "occasional"-right side  ? ?Past Surgical History:  ?Procedure Laterality Date  ? LAPAROSCOPIC ADRENALECTOMY Left 05/12/2014  ? Procedure: LAPAROSCOPIC LEFT ADRENALECTOMY;  Surgeon: Michael Boston, MD;  Location: [facility];  Service: General;  Laterality: Left;  ? Leg fracture repair Right 1996  ? metal plate in place, with bone graft  ? ? ?Allergies ? ?Allergies  ?Allergen Reactions  ? Citalopram Hydrobromide   ?  Pt did not like the way the pill made him feel " just didn't care about anything"  ? Codeine Itching  ?  PT denies Codeine allergy  ? Dilaudid [Hydromorphone Hcl]   ? Tramadol   ? Vicodin [Hydrocodone-Acetaminophen]   ? ? ?History of Present Illness  ?  ?Jacob Christian is a 81 y.o. male who presents via audio/video conferencing for a telehealth visit today.  Pt was last seen in cardiology clinic on 06/18/21 by Dr. Radford Pax.  At that time Jacob Christian was doing well well.  Seen by pharmacy team 07/31/21 for blood pressure management. The patient is now pending prostate US biopsy.  Since his last visit, he doing well.  ? ?The patient denies nausea, vomiting, fever, chest pain, palpitations, shortness of breath, orthopnea, PND, dizziness, syncope, cough, congestion, abdominal pain, hematochezia, melena, lower extremity edema.  ? ? ?Home Medications  ?  ?Prior to Admission medications   ?Medication Sig Start Date End Date Taking? Authorizing Provider  ?acetaminophen (TYLENOL) 500 MG tablet  Take 1,000 mg by mouth every 6 (six) hours as needed for mild pain or fever.    [provider]  ?albuterol (VENTOLIN HFA) 108 (90 Base) MCG/ACT inhaler Inhale 1 puff into the lungs every 4 (four) hours as needed. 07/15/21   [provider]  ?ALPRAZolam Duanne Moron) 1 MG tablet Take 1 mg by mouth 3 (three) times daily as needed for anxiety. 10/20/19   [provider]  ?apixaban (ELIQUIS) 5 MG TABS tablet Take 1 tablet  (5 mg total) by mouth 2 (two) times daily. 07/10/20   Fenton, Clint R, PA  ?Azelastine HCl 137 MCG/SPRAY SOLN Place 1 spray into the nose daily as needed (allergies). 12/06/18   [provider]  ?Judithann Sauger AEROSPHERE 160-9-4.8 MCG/ACT AERO Inhale 2 puffs into the lungs 2 (two) times daily. 06/17/21   [provider]  ?diltiazem (CARDIZEM CD) 240 MG 24 hr capsule Take 1 capsule (240 mg total) by mouth daily. 06/27/21   Sueanne Margarita, MD  ?fluticasone (FLONASE) 50 MCG/ACT nasal spray Place 1 spray into both nostrils at bedtime. 10/20/19   [provider]  ?furosemide (LASIX) 20 MG tablet Take 20 mg by mouth daily. 07/13/21   [provider]  ?lisinopril (PRINIVIL,ZESTRIL) 40 MG tablet Take 40 mg by mouth daily.    [provider]  ?loratadine (CLARITIN) 10 MG tablet Take 10 mg by mouth daily.    [provider]  ?Multiple Vitamin (ONE-A-DAY MENS PO) Take 1 tablet by mouth every other day. Centrum silver    [provider]  ?Oxycodone HCl 10 MG TABS Take 10 mg by mouth 3 (three) times daily as needed (pain). 06/01/20   [provider]  ?pantoprazole (PROTONIX) 40 MG tablet Take 40 mg by mouth daily. 06/19/21   [provider]  ?polyvinyl alcohol (LIQUIFILM TEARS) 1.4 % ophthalmic solution Place 1 drop into both eyes 3 (three) times daily as needed for dry eyes.    [provider]  ?tamsulosin (FLOMAX) 0.4 MG CAPS capsule Take 0.4 mg by mouth at bedtime. 07/01/21   [provider]  ?vitamin C (ASCORBIC ACID) 500 MG tablet Take 500 mg by mouth 2 (two) times a week.    [provider]  ?VITAMIN D-VITAMIN K PO Take 1 tablet by mouth 2 (two) times a week.    [provider]  ?vitamin E 180 MG (400 UNITS) capsule Take 400 Units by mouth 2 (two) times a week.    [provider]  ? ? ?Physical Exam  ?  ?Vital Signs:  Jacob Christian does not have vital signs available for review today. ? ?Given telephonic nature  of communication, physical exam is limited. ?AAOx3. NAD. Normal affect.  Speech and respirations are unlabored. ? ?Accessory Clinical Findings  ?  ?None ? ?Assessment & Plan  ?  ?1.  Preoperative Cardiovascular Risk Assessment: ? ?Given past medical history and time since last visit, based on ACC/AHA guidelines, Jacob Christian would be at acceptable risk for the planned procedure without further cardiovascular testing.  ? ?Please call with questions. ? ?Leanor Kail, PA ?08/06/2021, 2:19 PM  ? ?"Per office protocol, patient can hold eliquis for 3 days prior to procedure.   ?Patient will not need bridging with Lovenox (enoxaparin) around procedure". ? ?A copy of this note will be routed to requesting surgeon. ? ?Time:   ?Today, I have spent 7 minutes with the patient with telehealth technology discussing medical history, symptoms, and management plan.   ? ? ?  Leanor Kail, [place] ? ?08/06/2021, 1:12 PM  ?

## 2021-09-06 DIAGNOSIS — R350 Frequency of micturition: Secondary | ICD-10-CM | POA: Diagnosis not present

## 2021-09-06 DIAGNOSIS — R3912 Poor urinary stream: Secondary | ICD-10-CM | POA: Diagnosis not present

## 2021-09-10 ENCOUNTER — Other Ambulatory Visit (HOSPITAL_COMMUNITY): Payer: Self-pay | Admitting: *Deleted

## 2021-09-10 MED ORDER — APIXABAN 5 MG PO TABS: 5.0000 mg | ORAL_TABLET | Freq: Two times a day (BID) | ORAL | 11 refills | Status: DC

## 2021-09-13 ENCOUNTER — Other Ambulatory Visit: Payer: Self-pay | Admitting: Urology

## 2021-09-13 ENCOUNTER — Other Ambulatory Visit (HOSPITAL_COMMUNITY): Payer: Self-pay | Admitting: Urology

## 2021-09-13 DIAGNOSIS — C61 Malignant neoplasm of prostate: Secondary | ICD-10-CM

## 2021-09-19 ENCOUNTER — Other Ambulatory Visit (HOSPITAL_COMMUNITY): Payer: Medicare Other

## 2021-09-20 ENCOUNTER — Encounter (HOSPITAL_COMMUNITY)
Admission: RE | Admit: 2021-09-20 | Discharge: 2021-09-20 | Disposition: A | Discharge status: Home / Self Care | Payer: Medicare Other | Source: Ambulatory Visit | Attending: Urology | Admitting: Urology

## 2021-09-20 ENCOUNTER — Ambulatory Visit (HOSPITAL_COMMUNITY)
Admission: RE | Admit: 2021-09-20 | Discharge: 2021-09-20 | Disposition: A | Discharge status: Home / Self Care | Payer: Medicare Other | Source: Ambulatory Visit | Attending: Urology | Admitting: Urology

## 2021-09-20 DIAGNOSIS — C61 Malignant neoplasm of prostate: Secondary | ICD-10-CM | POA: Diagnosis present

## 2021-09-20 DIAGNOSIS — M170 Bilateral primary osteoarthritis of knee: Secondary | ICD-10-CM | POA: Diagnosis not present

## 2021-09-20 MED ORDER — TECHNETIUM TC 99M MEDRONATE IV KIT
20.0000 | PACK | Freq: Once | INTRAVENOUS | Status: AC | PRN
  Administered 2021-09-20: 19.5 via INTRAVENOUS

## 2021-09-30 DIAGNOSIS — M150 Primary generalized (osteo)arthritis: Secondary | ICD-10-CM | POA: Diagnosis not present

## 2021-09-30 DIAGNOSIS — L237 Allergic contact dermatitis due to plants, except food: Secondary | ICD-10-CM | POA: Diagnosis not present

## 2021-09-30 NOTE — Progress Notes (Signed)
GU Location of Tumor / Histology: Prostate Ca  If Prostate Cancer, Gleason Score is (4 + 5) and PSA is (10.2 pre-biopsy on 06/2021)  Biopsies:      CT Abdomen and Pelvis with/without Contrast 07/15/2021    Past/Anticipated interventions by urology, if any: NA  Past/Anticipated interventions by medical oncology, if any:  NA  Weight changes, if any: {:18581}  IPSS: SHIM:  Bowel/Bladder complaints, if any: {:18581}   Nausea/Vomiting, if any: {:18581}  Pain issues, if any:  {:18581}  SAFETY ISSUES: Prior radiation? {:18581} Pacemaker/ICD? {:18581} Possible current pregnancy? Male Is the patient on methotrexate? No  Current Complaints / other details:

## 2021-10-01 ENCOUNTER — Ambulatory Visit: Payer: Medicare Other

## 2021-10-01 ENCOUNTER — Other Ambulatory Visit: Payer: Self-pay

## 2021-10-01 ENCOUNTER — Ambulatory Visit
Admission: RE | Admit: 2021-10-01 | Discharge: 2021-10-01 | Disposition: A | Discharge status: Home / Self Care | Payer: Medicare Other | Source: Ambulatory Visit | Attending: Radiation Oncology | Admitting: Radiation Oncology

## 2021-10-01 ENCOUNTER — Ambulatory Visit: Payer: Medicare Other | Admitting: Radiation Oncology

## 2021-10-01 VITALS — Ht 71.0 in | Wt 137.8 lb

## 2021-10-01 DIAGNOSIS — Z191 Hormone sensitive malignancy status: Secondary | ICD-10-CM | POA: Diagnosis not present

## 2021-10-01 DIAGNOSIS — C61 Malignant neoplasm of prostate: Secondary | ICD-10-CM | POA: Insufficient documentation

## 2021-10-01 NOTE — Progress Notes (Signed)
Left voicemail for patient to return call to introduce and assess navigation needs.

## 2021-10-01 NOTE — Progress Notes (Signed)
Radiation Oncology         [phone]) [phone] ________________________________  Initial Outpatient Consultation - Conducted via MyChart due to current COVID-19 concerns for limiting patient exposure  Name: Jacob Christian MRN: [id number]  Date: 10/01/2021  DOB: 04/23/1940  [id number], Geryl Rankins, MD  Robley Fries, MD   REFERRING PHYSICIAN: Robley Fries, MD  DIAGNOSIS: 81 y.o. gentleman with Stage T2a adenocarcinoma of the prostate with Gleason score of 4+5, and PSA of 10.2.    ICD-10-CM   1. Malignant neoplasm of prostate ([place])  C61       HISTORY OF PRESENT ILLNESS: Jacob Christian is a 81 y.o. male with a diagnosis of prostate cancer. He was noted to have microscopic hematuria and persistent LUTS by his primary care physician, Dr. Esmeralda Links.  Accordingly, he was referred for evaluation in urology by Dr. Claudia Desanctis on 07/01/21,  digital rectal examination was performed at that time revealing a firm ridge at the right base. PSA performed that day was elevated at 10.2. He underwent CT AP on 07/15/21 to further evaluate his microscopic hematuria and this did not show any clear cause for the microhematuria but there was some nonspecific asymmetric enhancement within the right lobe of prostate gland.  The patient proceeded to transrectal ultrasound with 12 biopsies of the prostate on 08/30/21.  The prostate volume measured 36.83 cc.  Out of 12 core biopsies, 5 were positive, all on the right side.  The maximum Gleason score was 4+5, and this was seen in the right base and right base lateral. Additionally, Gleason 4+4 was seen in the right mid, Gleason 4+3 in the right mid lateral, and 3+4 in the right apex.  A bone scan was performed on 09/20/21 for disease staging and did not show any evidence of osseous metastatic disease.  The patient reviewed the biopsy results with his urologist and he has kindly been referred today for discussion of potential radiation treatment options.   PREVIOUS RADIATION  THERAPY: No  PAST MEDICAL HISTORY:  Past Medical History:  Diagnosis Date   Anxiety    Aortic stenosis    Arthritis    Carotid artery stenosis    1 to 39% carotid artery stenosis by Dopplers.  Right subclavian artery stenosis and resistive flow in the left vertebral artery.   COPD (chronic obstructive pulmonary disease) (HCC)    "borderline"   Dermatitis    GERD (gastroesophageal reflux disease)    Hearing loss    Heart murmur    "leaky valve" being monitored   Hypertension    Leg lesion    Peripheral vascular disease ([place])    Retroperitoneal mass LUQ s/p lap-assisted resection & left adrenalectomy 05/12/2014 05/12/2014   Sciatica    "occasional"-right side      PAST SURGICAL HISTORY: Past Surgical History:  Procedure Laterality Date   LAPAROSCOPIC ADRENALECTOMY Left 05/12/2014   Procedure: LAPAROSCOPIC LEFT ADRENALECTOMY;  Surgeon: Michael Boston, MD;  Location: [facility];  Service: General;  Laterality: Left;   Leg fracture repair Right 1996   metal plate in place, with bone graft    FAMILY HISTORY:  Family History  Problem Relation Age of Onset   Throat cancer Mother    CVA Father    Hypertension Father    CVA Brother    Pancreatic cancer Maternal Uncle    Throat cancer Maternal Grandmother    Colon cancer Brother     SOCIAL HISTORY:  Social History   Socioeconomic History   Marital status: Divorced  Spouse name: Not on file   Number of children: Not on file   Years of education: Not on file   Highest education level: Not on file  Occupational History   Not on file  Tobacco Use   Smoking status: Former    Packs/day: 0.50    Years: 50.00    Total pack years: 25.00    Types: Cigarettes    Quit date: 2010    Years since quitting: 13.4   Smokeless tobacco: Never  Substance and Sexual Activity   Alcohol use: Yes    Alcohol/week: 2.0 - 3.0 standard drinks of alcohol    Types: 2 - 3 Glasses of wine per week   Drug use: Yes    Types: Marijuana    Comment:  once daily   Sexual activity: Not on file  Other Topics Concern   Not on file  Social History Narrative   Not on file   Social Determinants of Health   Financial Resource Strain: Not on file  Food Insecurity: Not on file  Transportation Needs: Not on file  Physical Activity: Not on file  Stress: Not on file  Social Connections: Not on file  Intimate Partner Violence: Not on file    ALLERGIES: Citalopram hydrobromide, Codeine, Dilaudid [hydromorphone hcl], Tramadol, and Vicodin [hydrocodone-acetaminophen]  MEDICATIONS:  Current Outpatient Medications  Medication Sig Dispense Refill   acetaminophen (TYLENOL) 500 MG tablet Take 1,000 mg by mouth every 6 (six) hours as needed for mild pain or fever.     albuterol (VENTOLIN HFA) 108 (90 Base) MCG/ACT inhaler Inhale 1 puff into the lungs every 4 (four) hours as needed.     ALPRAZolam (XANAX) 1 MG tablet Take 1 mg by mouth 3 (three) times daily as needed for anxiety.     apixaban (ELIQUIS) 5 MG TABS tablet Take 1 tablet (5 mg total) by mouth 2 (two) times daily. 60 tablet 11   Azelastine HCl 137 MCG/SPRAY SOLN Place 1 spray into the nose daily as needed (allergies).     BREZTRI AEROSPHERE 160-9-4.8 MCG/ACT AERO Inhale 2 puffs into the lungs 2 (two) times daily.     diltiazem (CARDIZEM CD) 240 MG 24 hr capsule Take 1 capsule (240 mg total) by mouth daily. 90 capsule 3   fluticasone (FLONASE) 50 MCG/ACT nasal spray Place 1 spray into both nostrils at bedtime.     furosemide (LASIX) 20 MG tablet Take 20 mg by mouth daily.     lisinopril (PRINIVIL,ZESTRIL) 40 MG tablet Take 40 mg by mouth daily.     loratadine (CLARITIN) 10 MG tablet Take 10 mg by mouth daily.     Multiple Vitamin (ONE-A-DAY MENS PO) Take 1 tablet by mouth every other day. Centrum silver     Oxycodone HCl 10 MG TABS Take 10 mg by mouth 3 (three) times daily as needed (pain).     pantoprazole (PROTONIX) 40 MG tablet Take 40 mg by mouth daily.     polyvinyl alcohol (LIQUIFILM  TEARS) 1.4 % ophthalmic solution Place 1 drop into both eyes 3 (three) times daily as needed for dry eyes.     tamsulosin (FLOMAX) 0.4 MG CAPS capsule Take 0.4 mg by mouth at bedtime.     triamcinolone ointment (KENALOG) 0.1 % Apply 1 Application topically 2 (two) times daily.     vitamin C (ASCORBIC ACID) 500 MG tablet Take 500 mg by mouth 2 (two) times a week.     VITAMIN D-VITAMIN K PO Take 1 tablet by  mouth 2 (two) times a week.     vitamin E 180 MG (400 UNITS) capsule Take 400 Units by mouth 2 (two) times a week.     No current facility-administered medications for this encounter.    REVIEW OF SYSTEMS:  On review of systems, the patient reports that he is doing well overall. He denies any chest pain, shortness of breath, cough, fevers, chills, night sweats. His daughter reports that he has had weight loss of 10-12 lbs since 04/2021. He denies any bowel disturbances, and denies abdominal pain, nausea or vomiting. He denies any new musculoskeletal or joint aches or pains. He reports chronic pain, managed by PCP. His IPSS was 7, indicating mild urinary symptoms which are significantly improved since starting Flomax. A complete review of systems is obtained and is otherwise negative.    PHYSICAL EXAM:  Wt Readings from Last 3 Encounters:  10/01/21 137 lb 12.8 oz (62.5 kg)  06/18/21 139 lb 9.6 oz (63.3 kg)  10/04/20 158 lb 3.2 oz (71.8 kg)   Temp Readings from Last 3 Encounters:  11/14/20 97.8 F (36.6 C) (Oral)  05/18/20 (!) 97.3 F (36.3 C) (Tympanic)  12/23/19 (!) 97.3 F (36.3 C) (Tympanic)   BP Readings from Last 3 Encounters:  07/31/21 (!) 110/56  06/18/21 (!) 129/56  11/14/20 (!) 159/95   Pulse Readings from Last 3 Encounters:  07/31/21 90  06/18/21 64  11/14/20 61    /10  In general this is a well appearing Caucasian male in no acute distress. He's alert and oriented x4 and appropriate throughout the examination. Cardiopulmonary assessment is negative for acute distress  and he exhibits normal effort.    KPS = 90  100 - Normal; no complaints; no evidence of disease. 90   - Able to carry on normal activity; minor signs or symptoms of disease. 80   - Normal activity with effort; some signs or symptoms of disease. 61   - Cares for self; unable to carry on normal activity or to do active work. 60   - Requires occasional assistance, but is able to care for most of his personal needs. 50   - Requires considerable assistance and frequent medical care. 4   - Disabled; requires special care and assistance. 36   - Severely disabled; hospital admission is indicated although death not imminent. 50   - Very sick; hospital admission necessary; active supportive treatment necessary. 10   - Moribund; fatal processes progressing rapidly. 0     - Dead  Karnofsky DA, Abelmann Clyde, Craver LS and Burchenal JH [phone]) The use of the nitrogen mustards in the palliative treatment of carcinoma: with particular reference to bronchogenic carcinoma Cancer 1 634-56  LABORATORY DATA:  Lab Results  Component Value Date   WBC 12.1 (H) 11/14/2020   HGB 13.9 11/14/2020   HCT 39.3 11/14/2020   MCV 85.6 11/14/2020   PLT 355 11/14/2020   Lab Results  Component Value Date   NA 127 (L) 06/26/2021   K 5.0 06/26/2021   CL 92 (L) 06/26/2021   CO2 23 06/26/2021   Lab Results  Component Value Date   ALT 11 11/14/2020   AST 20 11/14/2020   ALKPHOS 86 11/14/2020   BILITOT 0.7 11/14/2020     RADIOGRAPHY: NM Bone Scan Whole Body  Result Date: 09/22/2021 CLINICAL DATA:  Prostate cancer EXAM: NUCLEAR MEDICINE WHOLE BODY BONE SCAN TECHNIQUE: Whole body anterior and posterior images were obtained approximately 3 hours after intravenous injection of radiopharmaceutical.  RADIOPHARMACEUTICALS:  19.5 mCi Technetium-53mMDP IV COMPARISON:  No prior scintigraphic examination. Findings are correlated with chest radiograph of 11/14/2020 and CT abdomen pelvis of 06/14/2021 FINDINGS: Focal uptake of  radiotracer within the proximal left humerus noted, posttraumatic in nature. Uptake of radiotracer within the thoracolumbar spine, particularly along the right lateral aspect of L3-4 is degenerative in nature. Focal uptake of radiotracer within the right ilium corresponds to a small osteochondroma or tug lesion better seen on previously performed CT examination. Degenerative changes are noted within the knees bilaterally. No foci of uptake identified to suggest osseous metastatic disease. Normal soft tissue distribution. Normal uptake and excretion within the kidneys and bladder. IMPRESSION: No evidence of osseous metastatic disease Electronically Signed   By: AFidela SalisburyM.D.   On: 09/22/2021 00:16      IMPRESSION/PLAN: This visit was conducted via MyChart to spare the patient unnecessary potential exposure in the healthcare setting during the current COVID-19 pandemic. 1. 81y.o. gentleman with Stage T2a adenocarcinoma of the prostate with Gleason Score of 4+5, and PSA of 10.2. We discussed the patient's workup and outlined the nature of prostate cancer in this setting. The patient's T stage, Gleason's score, and PSA put him into the high risk group. Accordingly, he is eligible for a variety of potential treatment options including prostatectomy or LT-ADT in combination with either 8 weeks of external radiation or 5 weeks of external radiation preceded by a brachytherapy boost. We discussed the available radiation techniques, and focused on the details and logistics of delivery.  He is not felt to be a good surgical candidate given his multiple medical comorbidities.  Therefore, we discussed and outlined the risks, benefits, short and long-term effects associated with daily external beam radiotherapy and compared and contrasted these with prostatectomy. We discussed the role of SpaceOAR in reducing the rectal toxicity associated with radiotherapy. We also detailed the role of ADT in the treatment of high  risk prostate cancer and outlined the associated side effects that could be expected with this therapy. He appears to have a good understanding of his disease and our treatment recommendations which are of curative intent.  He was encouraged to ask questions that were answered to his stated satisfaction.  At the end of the conversation, the patient is interested in moving forward with 8 weeks of external beam therapy concurrent with LT-ADT. He is scheduled to receive his first ADT on 10/02/21. We will share our discussion with Dr. PClaudia Desanctisand make arrangements for fiducial markers and SpaceOAR gel placement in 11/2021, prior to simulation, to reduce rectal toxicity from radiotherapy. The patient appears to have a good understanding of his disease and our treatment recommendations which are of curative intent and is in agreement with the stated plan.  Therefore, we will move forward with treatment planning accordingly, in anticipation of beginning IMRT in approximately 2 months after starting ADT.   Given current concerns for patient exposure during the COVID-19 pandemic, this encounter was conducted via video-enabled MyChart visit. The patient has given verbal consent for this type of encounter. The attendants for this meeting include MTyler PitaMD, Ashlyn Bruning PA-C, and patient HGERSON FAUTHand his daughter, KJoelene Millin During the encounter, MTyler PitaMD and AFreeman CaldronPA-C [facility].  Patient HJAKIAH GOREEand his daughter, KJoelene Millinwere located at home.  We personally spent 70 minutes in this encounter including chart review, reviewing radiological studies, meeting face-to-face with the patient,  entering orders and completing documentation.    Nicholos Johns, PA-C    Tyler Pita, MD  [facility] Direct Dial: [phone]  Fax: [phone] .com  Skype   LinkedIn   This document serves as a record of services personally performed by Tyler Pita, MD and Freeman Caldron, PA-C. It was created on their behalf by Wilburn Mylar, a trained medical scribe. The creation of this record is based on the scribe's personal observations and the provider's statements to them. This document has been checked and approved by the attending provider.

## 2021-10-02 DIAGNOSIS — Z5111 Encounter for antineoplastic chemotherapy: Secondary | ICD-10-CM | POA: Diagnosis not present

## 2021-10-08 ENCOUNTER — Telehealth: Payer: Self-pay | Admitting: *Deleted

## 2021-10-08 NOTE — Telephone Encounter (Signed)
CALLED PATIENT TO INFORM OF FID. MARKERS AND SPACE OAR PLACEMENT ON 11-19-21 AND HIS SIM ON 11-21-21- ARRIVAL TIME- 2:45 PM @ [facility], INFORMED PATIENT TO ARRIVE WITH A FULL BLADDER AND A EMPTY BOWEL, SPOKE WITH PATIENT'S DAUGHTER KIMBERLY AND SHE IS AWARE OF THESE APPTS. AND THE INSTRUCTIONS

## 2021-10-17 DIAGNOSIS — Z974 Presence of external hearing-aid: Secondary | ICD-10-CM | POA: Diagnosis not present

## 2021-10-17 DIAGNOSIS — H903 Sensorineural hearing loss, bilateral: Secondary | ICD-10-CM | POA: Diagnosis not present

## 2021-10-17 DIAGNOSIS — H6123 Impacted cerumen, bilateral: Secondary | ICD-10-CM | POA: Diagnosis not present

## 2021-10-22 ENCOUNTER — Ambulatory Visit (HOSPITAL_COMMUNITY): Payer: Medicare Other

## 2021-10-23 ENCOUNTER — Other Ambulatory Visit: Payer: Self-pay | Admitting: Urology

## 2021-10-23 DIAGNOSIS — M150 Primary generalized (osteo)arthritis: Secondary | ICD-10-CM | POA: Diagnosis not present

## 2021-10-23 MED ORDER — FLEET ENEMA 7-19 GM/118ML RE ENEM: 1.0000 | ENEMA | Freq: Once | RECTAL | Status: AC

## 2021-11-04 ENCOUNTER — Other Ambulatory Visit: Payer: Self-pay | Admitting: Internal Medicine

## 2021-11-07 ENCOUNTER — Telehealth: Payer: Self-pay | Admitting: Radiation Oncology

## 2021-11-07 NOTE — Telephone Encounter (Signed)
Pt has decided against treatment and asked for all appts to be canceled. Spoke to daughter and advised if he changes his mind to give [place] a call.

## 2021-11-18 NOTE — Progress Notes (Signed)
Left message with patient for call back to review treatment decision/plan.

## 2021-11-18 NOTE — Progress Notes (Signed)
Patient had a Rad Onc consult on 10/01/2021 for stage T2a adenocarcinoma of the prostate with Gleason Score of 4+5, and PSA of 10.2.  Patient agreeable to move forward with 8 weeks of external beam therapy concurrent with LT-ADT, and received his first ADT on 10/02/21.   RN spoke with patient's daughter, Joelene Millin.  Patient has decided not to pursue any treatment at this time due to fear of worsening pain, and side effects.  Patient with history of chronic pain and is currently under palliative care for this.    RN provided education regarding his high risk disease and understanding that this potentially has the chance of turning metastatic with no treatment.  Daughter verbalized understanding.    RN provided direct contact information and encouraged them to reach out with any questions, or with any treatment decision changes.  MD's notified.

## 2021-11-19 ENCOUNTER — Encounter (HOSPITAL_BASED_OUTPATIENT_CLINIC_OR_DEPARTMENT_OTHER): Admission: RE | Payer: Self-pay | Source: Home / Self Care

## 2021-11-19 ENCOUNTER — Ambulatory Visit (HOSPITAL_BASED_OUTPATIENT_CLINIC_OR_DEPARTMENT_OTHER): Admission: RE | Admit: 2021-11-19 | Payer: Medicare Other | Source: Home / Self Care | Admitting: Urology

## 2021-11-19 DIAGNOSIS — M5451 Vertebrogenic low back pain: Secondary | ICD-10-CM | POA: Diagnosis not present

## 2021-11-19 DIAGNOSIS — M25561 Pain in right knee: Secondary | ICD-10-CM | POA: Diagnosis not present

## 2021-11-19 DIAGNOSIS — M1711 Unilateral primary osteoarthritis, right knee: Secondary | ICD-10-CM | POA: Diagnosis not present

## 2021-11-19 SURGERY — INSERTION, GOLD SEEDS
Anesthesia: Monitor Anesthesia Care

## 2021-11-21 ENCOUNTER — Ambulatory Visit: Payer: Medicaid Other | Admitting: Radiation Oncology

## 2022-02-24 DIAGNOSIS — I480 Paroxysmal atrial fibrillation: Secondary | ICD-10-CM | POA: Diagnosis not present

## 2022-02-24 DIAGNOSIS — M545 Low back pain, unspecified: Secondary | ICD-10-CM | POA: Diagnosis not present

## 2022-02-24 DIAGNOSIS — M150 Primary generalized (osteo)arthritis: Secondary | ICD-10-CM | POA: Diagnosis not present

## 2022-02-24 DIAGNOSIS — M5430 Sciatica, unspecified side: Secondary | ICD-10-CM | POA: Diagnosis not present

## 2022-03-17 DIAGNOSIS — S39012A Strain of muscle, fascia and tendon of lower back, initial encounter: Secondary | ICD-10-CM | POA: Diagnosis not present

## 2022-03-25 ENCOUNTER — Other Ambulatory Visit (HOSPITAL_COMMUNITY): Payer: Self-pay | Admitting: *Deleted

## 2022-03-25 MED ORDER — APIXABAN 5 MG PO TABS: 5.0000 mg | ORAL_TABLET | Freq: Two times a day (BID) | ORAL | 1 refills | Status: DC

## 2022-03-31 DIAGNOSIS — D649 Anemia, unspecified: Secondary | ICD-10-CM | POA: Diagnosis not present

## 2022-03-31 DIAGNOSIS — E871 Hypo-osmolality and hyponatremia: Secondary | ICD-10-CM | POA: Diagnosis not present

## 2022-04-09 DIAGNOSIS — M25561 Pain in right knee: Secondary | ICD-10-CM | POA: Diagnosis not present

## 2022-04-09 DIAGNOSIS — M1711 Unilateral primary osteoarthritis, right knee: Secondary | ICD-10-CM | POA: Diagnosis not present

## 2022-04-16 DIAGNOSIS — E871 Hypo-osmolality and hyponatremia: Secondary | ICD-10-CM | POA: Diagnosis not present

## 2022-05-13 DIAGNOSIS — H61899 Other specified disorders of external ear, unspecified ear: Secondary | ICD-10-CM | POA: Diagnosis not present

## 2022-05-13 DIAGNOSIS — H903 Sensorineural hearing loss, bilateral: Secondary | ICD-10-CM | POA: Diagnosis not present

## 2022-05-31 ENCOUNTER — Other Ambulatory Visit: Payer: Self-pay | Admitting: Cardiology

## 2022-06-12 DIAGNOSIS — I480 Paroxysmal atrial fibrillation: Secondary | ICD-10-CM | POA: Diagnosis not present

## 2022-08-12 DIAGNOSIS — H6123 Impacted cerumen, bilateral: Secondary | ICD-10-CM | POA: Diagnosis not present

## 2022-08-12 DIAGNOSIS — H903 Sensorineural hearing loss, bilateral: Secondary | ICD-10-CM | POA: Diagnosis not present

## 2022-08-20 ENCOUNTER — Other Ambulatory Visit: Payer: Self-pay | Admitting: Cardiology

## 2022-08-22 ENCOUNTER — Other Ambulatory Visit: Payer: Self-pay | Admitting: Cardiology

## 2022-09-02 DIAGNOSIS — Z0000 Encounter for general adult medical examination without abnormal findings: Secondary | ICD-10-CM | POA: Diagnosis not present

## 2022-09-02 DIAGNOSIS — K219 Gastro-esophageal reflux disease without esophagitis: Secondary | ICD-10-CM | POA: Diagnosis not present

## 2022-09-02 DIAGNOSIS — I480 Paroxysmal atrial fibrillation: Secondary | ICD-10-CM | POA: Diagnosis not present

## 2022-09-02 DIAGNOSIS — Z23 Encounter for immunization: Secondary | ICD-10-CM | POA: Diagnosis not present

## 2022-09-02 DIAGNOSIS — I350 Nonrheumatic aortic (valve) stenosis: Secondary | ICD-10-CM | POA: Diagnosis not present

## 2022-09-02 DIAGNOSIS — I739 Peripheral vascular disease, unspecified: Secondary | ICD-10-CM | POA: Diagnosis not present

## 2022-09-02 DIAGNOSIS — J449 Chronic obstructive pulmonary disease, unspecified: Secondary | ICD-10-CM | POA: Diagnosis not present

## 2022-09-02 DIAGNOSIS — I10 Essential (primary) hypertension: Secondary | ICD-10-CM | POA: Diagnosis not present

## 2022-09-09 ENCOUNTER — Other Ambulatory Visit: Payer: Self-pay | Admitting: Cardiology

## 2022-09-19 DIAGNOSIS — Z1211 Encounter for screening for malignant neoplasm of colon: Secondary | ICD-10-CM | POA: Diagnosis not present

## 2022-09-22 ENCOUNTER — Other Ambulatory Visit: Payer: Self-pay | Admitting: Cardiology

## 2022-09-30 DIAGNOSIS — M1711 Unilateral primary osteoarthritis, right knee: Secondary | ICD-10-CM | POA: Diagnosis not present

## 2022-09-30 DIAGNOSIS — M25561 Pain in right knee: Secondary | ICD-10-CM | POA: Diagnosis not present

## 2022-10-08 ENCOUNTER — Other Ambulatory Visit: Payer: Self-pay | Admitting: Cardiology

## 2022-10-24 ENCOUNTER — Other Ambulatory Visit: Payer: Self-pay | Admitting: Cardiology

## 2022-11-03 ENCOUNTER — Other Ambulatory Visit: Payer: Self-pay | Admitting: Cardiology

## 2022-11-06 DIAGNOSIS — H6123 Impacted cerumen, bilateral: Secondary | ICD-10-CM | POA: Diagnosis not present

## 2022-11-06 DIAGNOSIS — H903 Sensorineural hearing loss, bilateral: Secondary | ICD-10-CM | POA: Diagnosis not present

## 2022-12-01 ENCOUNTER — Other Ambulatory Visit: Payer: Self-pay | Admitting: Cardiology

## 2022-12-06 ENCOUNTER — Other Ambulatory Visit: Payer: Self-pay | Admitting: Cardiology

## 2023-01-05 ENCOUNTER — Other Ambulatory Visit: Payer: Self-pay | Admitting: Cardiology

## 2023-01-07 DIAGNOSIS — M1711 Unilateral primary osteoarthritis, right knee: Secondary | ICD-10-CM | POA: Diagnosis not present

## 2023-02-05 DIAGNOSIS — M1711 Unilateral primary osteoarthritis, right knee: Secondary | ICD-10-CM | POA: Diagnosis not present

## 2023-02-05 DIAGNOSIS — S46812A Strain of other muscles, fascia and tendons at shoulder and upper arm level, left arm, initial encounter: Secondary | ICD-10-CM | POA: Diagnosis not present

## 2023-02-13 DIAGNOSIS — M1711 Unilateral primary osteoarthritis, right knee: Secondary | ICD-10-CM | POA: Diagnosis not present

## 2023-02-20 DIAGNOSIS — M1711 Unilateral primary osteoarthritis, right knee: Secondary | ICD-10-CM | POA: Diagnosis not present

## 2023-02-20 DIAGNOSIS — M25512 Pain in left shoulder: Secondary | ICD-10-CM | POA: Diagnosis not present

## 2023-02-22 IMAGING — NM NM BONE WHOLE BODY
2 series · 2 of 2 positions shown · non-contrast
Comparison: No prior scintigraphic examination.

CLINICAL DATA: Prostate cancer

EXAM:
NUCLEAR MEDICINE WHOLE BODY BONE SCAN
TECHNIQUE: Whole body anterior and posterior images were obtained approximately
3 hours after intravenous injection of radiopharmaceutical.
RADIOPHARMACEUTICALS:  19.5 mCi Vechnetium-YYm MDP IV

[Series 1: whole body · 2.66mm/px · 1 of 1 slices shown (1 of 2)]
[im 1/1]
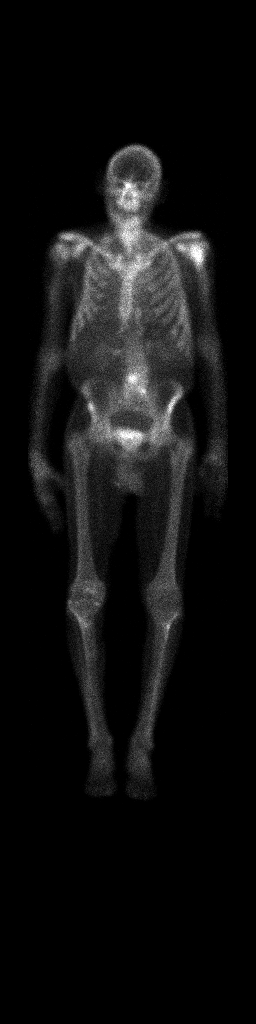

[Series 1: whole body · 2.66mm/px · 1 of 1 slices shown (2 of 2)]
[im 1/1]
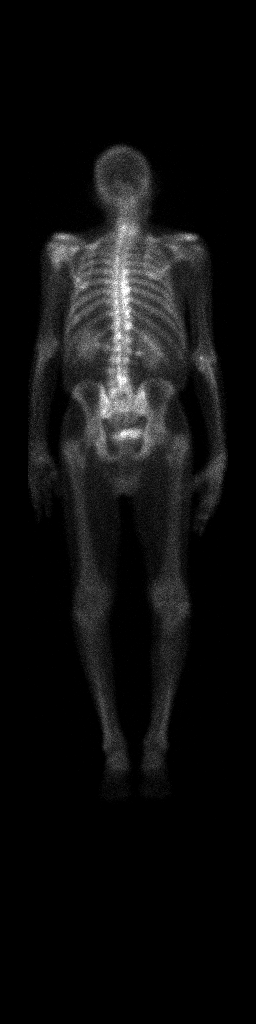

[2 of 2 positions shown; findings below may reference images not displayed]

Findings are
correlated with chest radiograph of 11/14/2020 and CT abdomen pelvis
of 06/14/2021
FINDINGS: Focal uptake of radiotracer within the proximal left humerus noted,
posttraumatic in nature. Uptake of radiotracer within the
thoracolumbar spine, particularly along the right lateral aspect of
L3-4 is degenerative in nature. Focal uptake of radiotracer within
the right ilium corresponds to a small osteochondroma or tug lesion
better seen on previously performed CT examination. Degenerative
changes are noted within the knees bilaterally. No foci of uptake
identified to suggest osseous metastatic disease. Normal soft tissue
distribution. Normal uptake and excretion within the kidneys and
bladder.
IMPRESSION: No evidence of osseous metastatic disease

## 2023-03-25 ENCOUNTER — Other Ambulatory Visit: Payer: Self-pay | Admitting: Family Medicine

## 2023-03-25 DIAGNOSIS — J449 Chronic obstructive pulmonary disease, unspecified: Secondary | ICD-10-CM | POA: Diagnosis not present

## 2023-03-25 DIAGNOSIS — R109 Unspecified abdominal pain: Secondary | ICD-10-CM | POA: Diagnosis not present

## 2023-03-25 DIAGNOSIS — I10 Essential (primary) hypertension: Secondary | ICD-10-CM | POA: Diagnosis not present

## 2023-03-25 DIAGNOSIS — I480 Paroxysmal atrial fibrillation: Secondary | ICD-10-CM | POA: Diagnosis not present

## 2023-03-25 DIAGNOSIS — I739 Peripheral vascular disease, unspecified: Secondary | ICD-10-CM | POA: Diagnosis not present

## 2023-03-25 DIAGNOSIS — M150 Primary generalized (osteo)arthritis: Secondary | ICD-10-CM | POA: Diagnosis not present

## 2023-03-25 DIAGNOSIS — I350 Nonrheumatic aortic (valve) stenosis: Secondary | ICD-10-CM | POA: Diagnosis not present

## 2023-03-25 DIAGNOSIS — K219 Gastro-esophageal reflux disease without esophagitis: Secondary | ICD-10-CM | POA: Diagnosis not present

## 2023-03-25 DIAGNOSIS — E871 Hypo-osmolality and hyponatremia: Secondary | ICD-10-CM | POA: Diagnosis not present

## 2023-03-26 ENCOUNTER — Encounter: Payer: Self-pay | Admitting: Family Medicine

## 2023-04-03 DIAGNOSIS — H6122 Impacted cerumen, left ear: Secondary | ICD-10-CM | POA: Diagnosis not present

## 2023-04-03 DIAGNOSIS — H9193 Unspecified hearing loss, bilateral: Secondary | ICD-10-CM | POA: Diagnosis not present

## 2023-05-14 ENCOUNTER — Other Ambulatory Visit (HOSPITAL_COMMUNITY): Payer: Self-pay

## 2023-05-14 MED ORDER — XTAMPZA ER 13.5 MG PO C12A
1.0000 | EXTENDED_RELEASE_CAPSULE | Freq: Two times a day (BID) | ORAL | 0 refills | Status: DC
  Filled 2023-05-14: qty 60, 30d supply, fill #0

## 2023-06-11 ENCOUNTER — Other Ambulatory Visit (HOSPITAL_COMMUNITY): Payer: Self-pay

## 2023-06-11 MED ORDER — XTAMPZA ER 13.5 MG PO C12A
EXTENDED_RELEASE_CAPSULE | ORAL | 0 refills | Status: DC
  Filled 2023-06-11: qty 60, 30d supply, fill #0

## 2023-06-12 ENCOUNTER — Other Ambulatory Visit (HOSPITAL_COMMUNITY): Payer: Self-pay

## 2023-07-10 ENCOUNTER — Other Ambulatory Visit (HOSPITAL_COMMUNITY): Payer: Self-pay

## 2023-07-10 MED ORDER — XTAMPZA ER 13.5 MG PO C12A
13.5000 mg | EXTENDED_RELEASE_CAPSULE | Freq: Two times a day (BID) | ORAL | 0 refills | Status: DC
  Filled 2023-07-10: qty 60, 30d supply, fill #0

## 2023-07-13 ENCOUNTER — Other Ambulatory Visit (HOSPITAL_COMMUNITY): Payer: Self-pay

## 2023-08-12 ENCOUNTER — Other Ambulatory Visit (HOSPITAL_COMMUNITY): Payer: Self-pay

## 2023-08-12 MED ORDER — XTAMPZA ER 13.5 MG PO C12A
13.5000 mg | EXTENDED_RELEASE_CAPSULE | Freq: Two times a day (BID) | ORAL | 0 refills | Status: DC
  Filled 2023-08-12: qty 60, 30d supply, fill #0

## 2023-08-13 ENCOUNTER — Other Ambulatory Visit (HOSPITAL_COMMUNITY): Payer: Self-pay

## 2023-08-14 ENCOUNTER — Other Ambulatory Visit (HOSPITAL_COMMUNITY): Payer: Self-pay

## 2023-08-28 ENCOUNTER — Other Ambulatory Visit: Payer: Self-pay | Admitting: Cardiology

## 2023-08-28 NOTE — Telephone Encounter (Signed)
 Pt is requesting a refill for Furosemide  20 mg. Has not been seen since 08/06/21 (video visit) Pt does not have and upcoming appt.   Please advice about the refill for this patient.   Thank you for all your help

## 2023-09-01 NOTE — Telephone Encounter (Signed)
 Call to patient to clarify if he still needs lasix  and to offer appt as he hasn't been seen in awhile. Spoke with Dtr Burdette Carolin (DPR) who states patient has prostate cancer and dementia. It has been easier for family to have all meds managed through PCP for time being. This has allowed them to keep patient more comfortable. She states she will call Dr. Ivey Marlin regarding this medication refill.

## 2023-09-10 ENCOUNTER — Other Ambulatory Visit (HOSPITAL_COMMUNITY): Payer: Self-pay

## 2023-09-10 MED ORDER — XTAMPZA ER 13.5 MG PO C12A
1.0000 | EXTENDED_RELEASE_CAPSULE | Freq: Two times a day (BID) | ORAL | 0 refills | Status: AC
  Filled 2023-09-10: qty 60, 30d supply, fill #0
  Filled 2023-09-11: qty 20, 10d supply, fill #0
  Filled 2023-09-11: qty 40, 20d supply, fill #0

## 2023-09-11 ENCOUNTER — Other Ambulatory Visit (HOSPITAL_COMMUNITY): Payer: Self-pay

## 2023-09-11 ENCOUNTER — Other Ambulatory Visit: Payer: Self-pay

## 2023-09-11 DIAGNOSIS — I10 Essential (primary) hypertension: Secondary | ICD-10-CM | POA: Diagnosis not present

## 2023-09-11 DIAGNOSIS — J449 Chronic obstructive pulmonary disease, unspecified: Secondary | ICD-10-CM | POA: Diagnosis not present

## 2023-09-11 DIAGNOSIS — Z0000 Encounter for general adult medical examination without abnormal findings: Secondary | ICD-10-CM | POA: Diagnosis not present

## 2023-09-11 DIAGNOSIS — I739 Peripheral vascular disease, unspecified: Secondary | ICD-10-CM | POA: Diagnosis not present

## 2023-09-11 DIAGNOSIS — I480 Paroxysmal atrial fibrillation: Secondary | ICD-10-CM | POA: Diagnosis not present

## 2023-09-11 DIAGNOSIS — R7309 Other abnormal glucose: Secondary | ICD-10-CM | POA: Diagnosis not present

## 2023-09-11 DIAGNOSIS — M150 Primary generalized (osteo)arthritis: Secondary | ICD-10-CM | POA: Diagnosis not present

## 2023-09-11 DIAGNOSIS — R195 Other fecal abnormalities: Secondary | ICD-10-CM | POA: Diagnosis not present

## 2023-09-11 DIAGNOSIS — I350 Nonrheumatic aortic (valve) stenosis: Secondary | ICD-10-CM | POA: Diagnosis not present

## 2023-09-15 ENCOUNTER — Other Ambulatory Visit (HOSPITAL_COMMUNITY): Payer: Self-pay | Admitting: Family Medicine

## 2023-09-15 DIAGNOSIS — I350 Nonrheumatic aortic (valve) stenosis: Secondary | ICD-10-CM

## 2023-11-02 ENCOUNTER — Ambulatory Visit (HOSPITAL_COMMUNITY)
Admission: RE | Admit: 2023-11-02 | Discharge: 2023-11-02 | Disposition: A | Discharge status: Home / Self Care | Source: Ambulatory Visit | Attending: Family Medicine | Admitting: Family Medicine

## 2023-11-02 DIAGNOSIS — I350 Nonrheumatic aortic (valve) stenosis: Secondary | ICD-10-CM | POA: Diagnosis not present

## 2023-11-02 LAB — ECHOCARDIOGRAM COMPLETE
AR max vel: 0.43 cm2
AV Area VTI: 0.43 cm2
AV Area mean vel: 0.44 cm2
AV Mean grad: 36 mmHg
AV Peak grad: 60.8 mmHg
Ao pk vel: 3.9 m/s
Area-P 1/2: 4.68 cm2
MV M vel: 5.38 m/s
MV Peak grad: 115.8 mmHg
MV VTI: 1.3 cm2
P 1/2 time: 424 ms
Radius: 0.6 cm
S' Lateral: 3.2 cm

## 2023-11-18 ENCOUNTER — Encounter: Payer: Self-pay | Admitting: Physician Assistant

## 2023-11-18 ENCOUNTER — Ambulatory Visit: Attending: Cardiology | Admitting: Physician Assistant

## 2023-11-18 VITALS — BP 120/70 | HR 60 | Ht 71.0 in | Wt 144.4 lb

## 2023-11-18 DIAGNOSIS — I350 Nonrheumatic aortic (valve) stenosis: Secondary | ICD-10-CM | POA: Insufficient documentation

## 2023-11-18 DIAGNOSIS — I10 Essential (primary) hypertension: Secondary | ICD-10-CM

## 2023-11-18 DIAGNOSIS — I340 Nonrheumatic mitral (valve) insufficiency: Secondary | ICD-10-CM | POA: Insufficient documentation

## 2023-11-18 DIAGNOSIS — I4819 Other persistent atrial fibrillation: Secondary | ICD-10-CM

## 2023-11-18 DIAGNOSIS — I429 Cardiomyopathy, unspecified: Secondary | ICD-10-CM | POA: Insufficient documentation

## 2023-11-18 MED ORDER — APIXABAN 5 MG PO TABS: 5.0000 mg | ORAL_TABLET | Freq: Two times a day (BID) | ORAL | 3 refills | Status: AC

## 2023-11-18 NOTE — Assessment & Plan Note (Signed)
 He remains in atrial fibrillation today.  Heart rate is controlled.  He had stopped Eliquis  due to concerns of bleeding in the past.  He has a significant risk for stroke.  I have recommended he resume Eliquis .  If his weight decreases to less than 60 kg, we would need to decrease his dose to 2.5 mg twice daily.  However, for now, he should remain on 5 mg twice daily. -Resume Eliquis  5 mg twice daily.  Prescription sent to his pharmacy. -Continue diltiazem  240 mg daily

## 2023-11-18 NOTE — Progress Notes (Signed)
 OFFICE NOTE:    Date:  11/18/2023  ID:  Jacob Christian, DOB 1940/07/22, MRN [id number] PCP: Leonel Cole, MD  [facility] Providers Cardiologist:  Wilbert Bihari, MD        Aortic stenosis  Mitral regurgitation  TTE 10/04/2020: EF 55-60, trivial MR, moderate AS (mean 21, V-max 279 cm/s, DI 0.25) TTE 11/02/2023: EF 45-50, global HK, low normal RVSF, severe LAE, mild RAE, moderate MR (potential torn cord anterior mitral apparatus, mechanism appears to be atrial functional), bifid anterior tricuspid valve leaflet, severe aortic stenosis (mean 36, V-max 390 cm/s, DI 0.18) Paroxysmal atrial fibrillation  MPI 02/2014: Low risk Hx of CVA (MRI 11/2018) Hypertension  Peripheral arterial disease  Carotid stenosis Carotid US  07/10/2021: Bilateral ICA 1-39, right subclavian stenosis Chronic Obstructive Pulmonary Disease   Prostate CA         Discussed the use of AI scribe software for clinical note transcription with the patient, who gave verbal consent to proceed. History of Present Illness Jacob Christian is a 83 y.o. male who returns for follow up of an abnormal Echocardiogram. Last seen in clinic by Jacob Jernigan, PA-C in 07/2021.   He is here with his daughter.  He experiences fatigue and shortness of breath. Shortness of breath occurs with exertion, such as walking at a faster pace. Occasionally, he experiences shortness of breath at rest and needs to sleep in a slightly elevated position to breathe comfortably. No chest pain is reported, but he has occasional indigestion relieved by Tums. He also experiences dizziness and has noticed recent ankle swelling, which he attributes to fluid retention.   He has a history of prostate cancer diagnosed two and a half years ago, considered aggressive. He opted out of radiation therapy after the initial diagnosis and has not pursued further treatment. There is a concern for possible metastatic disease.  He has not seen urology for  follow-up.  He had recent Hemoccult testing that was positive but has declined further testing.  He continues to smoke.  He denies alcohol  use but occasionally uses marijuana.     ROS-See HPI    Studies Reviewed:  EKG Interpretation Date/Time:  Wednesday November 18 2023 14:07:46 EDT Ventricular Rate:  60 PR Interval:    QRS Duration:  112 QT Interval:  462 QTC Calculation: 462 R Axis:   48  Text Interpretation: Atrial fibrillation Nonspecific ST and T wave abnormality Similar to last ECG Confirmed by Jacob Christian [phone]) on 11/18/2023 2:17:01 PM    Risk Assessment/Calculations:  CHA2DS2-VASc Score = 7   This indicates a 11.2% annual risk of stroke. The patient's score is based upon: CHF History: 1 HTN History: 1 Diabetes History: 0 Stroke History: 2 Vascular Disease History: 1 Age Score: 2 Gender Score: 0           Physical Exam:  VS:  BP 120/70   Pulse 60   Ht 5' 11 (1.803 m)   Wt 144 lb 6.4 oz (65.5 kg)   SpO2 97%   BMI 20.14 kg/m        Wt Readings from Last 3 Encounters:  11/18/23 144 lb 6.4 oz (65.5 kg)  10/01/21 137 lb 12.8 oz (62.5 kg)  06/18/21 139 lb 9.6 oz (63.3 kg)    Constitutional:      Appearance: Healthy appearance. Not in distress.  Neck:     Vascular: No JVR. JVD normal.     Comments: Carotid upstroke perserved Pulmonary:     Breath  sounds: Normal breath sounds. No wheezing. No rales.  Cardiovascular:     Normal rate. Irregularly irregular rhythm.     Murmurs: There is a grade 2/6 systolic murmur at the URSB.     Comments: S2 preserved Edema:    Peripheral edema absent.  Abdominal:     Palpations: Abdomen is soft.       Assessment and Plan:    Assessment & Plan Nonrheumatic aortic (valve) stenosis Cardiomyopathy, unspecified type (HCC) Nonrheumatic mitral valve regurgitation Pt now with reduced EF, severe AS and mod MR. I suspect he has stage D, symptomatic aortic stenosis.  We reviewed the natural progression of aortic stenosis  and the rationale for aortic valve intervention to reduce mortality.  We discussed next steps with further testing to include cardiac catheterization and possible TEE as well as CT scans.  He is not interested in any further testing.  Furthermore, he is not interested in aortic valve intervention.  We reviewed surgical aortic valve replacement versus transcatheter aortic valve replacement.  Again, he is not interested in proceeding.  In addition, he has a diagnosis of prostate cancer.  It sounds as though he may have more extensive disease, possibly metastatic.  He has not had follow-up since his initial visit for radiation seed implant.  Before proceeding with workup for valve intervention, we would need to ensure that he does not have terminal disease with his cancer.  As noted, he is not interested in follow-up for his prostate cancer.  We had a very long discussion regarding this today.  I noted that if he plans to do no further testing or intervention for his valvular heart disease or prostate cancer, it would be best for him to look into hospice.  His daughter notes that he currently is seeing palliative care.  I will ask Dr. Shlomo to review his echocardiogram.  I have also recommended the patient follow-up with Dr. Shlomo in the next 2 to 3 months. Persistent atrial fibrillation (HCC) He remains in atrial fibrillation today.  Heart rate is controlled.  He had stopped Eliquis  due to concerns of bleeding in the past.  He has a significant risk for stroke.  I have recommended he resume Eliquis .  If his weight decreases to less than 60 kg, we would need to decrease his dose to 2.5 mg twice daily.  However, for now, he should remain on 5 mg twice daily. -Resume Eliquis  5 mg twice daily.  Prescription sent to his pharmacy. -Continue diltiazem  240 mg daily Benign essential HTN Blood pressure is controlled.  Continue diltiazem  240 mg daily, lisinopril  40 mg daily.         Dispo:  Return in about 8 weeks  (around 01/13/2024) for Routine Follow Up, w/ Dr. Shlomo.  Signed, Glendia Ferrier, PA-C

## 2023-11-18 NOTE — Assessment & Plan Note (Addendum)
 Pt now with reduced EF, severe AS and mod MR. I suspect he has stage D, symptomatic aortic stenosis.  We reviewed the natural progression of aortic stenosis and the rationale for aortic valve intervention to reduce mortality.  We discussed next steps with further testing to include cardiac catheterization and possible TEE as well as CT scans.  He is not interested in any further testing.  Furthermore, he is not interested in aortic valve intervention.  We reviewed surgical aortic valve replacement versus transcatheter aortic valve replacement.  Again, he is not interested in proceeding.  In addition, he has a diagnosis of prostate cancer.  It sounds as though he may have more extensive disease, possibly metastatic.  He has not had follow-up since his initial visit for radiation seed implant.  Before proceeding with workup for valve intervention, we would need to ensure that he does not have terminal disease with his cancer.  As noted, he is not interested in follow-up for his prostate cancer.  We had a very long discussion regarding this today.  I noted that if he plans to do no further testing or intervention for his valvular heart disease or prostate cancer, it would be best for him to look into hospice.  His daughter notes that he currently is seeing palliative care.  I will ask Dr. Shlomo to review his echocardiogram.  I have also recommended the patient follow-up with Dr. Shlomo in the next 2 to 3 months.

## 2023-11-18 NOTE — Assessment & Plan Note (Signed)
 Blood pressure is controlled.  Continue diltiazem  240 mg daily, lisinopril  40 mg daily.

## 2023-11-18 NOTE — Patient Instructions (Signed)
 Medication Instructions:  Your physician recommends that you continue on your current medications as directed. Please refer to the Current Medication list given to you today.  *If you need a refill on your cardiac medications before your next appointment, please call your pharmacy*  Lab Work: None ordered  If you have labs (blood work) drawn today and your tests are completely normal, you will receive your results only by: MyChart Message (if you have MyChart) OR A paper copy in the mail If you have any lab test that is abnormal or we need to change your treatment, we will call you to review the results.  Testing/Procedures: None ordered  Follow-Up: At [facility], you and your health needs are our priority.  As part of our continuing mission to provide you with exceptional heart care, our providers are all part of one team.  This team includes your primary Cardiologist (physician) and Advanced Practice Providers or APPs (Physician Assistants and Nurse Practitioners) who all work together to provide you with the care you need, when you need it.  Your next appointment:   4 week(s)  Provider:   Wilbert Bihari, MD    We recommend signing up for the patient portal called MyChart.  Sign up information is provided on this After Visit Summary.  MyChart is used to connect with patients for Virtual Visits (Telemedicine).  Patients are able to view lab/test results, encounter notes, upcoming appointments, etc.  Non-urgent messages can be sent to your provider as well.   To learn more about what you can do with MyChart, go to [email or web address].   Other Instructions

## 2023-11-20 ENCOUNTER — Telehealth: Payer: Self-pay

## 2023-11-20 NOTE — Telephone Encounter (Signed)
 Call to patient to offer appt with Dr. Shlomo 12/08/23. Spoke with patient's dtr Luke (DPR) who states patient has refused further cardiology visits. She states he has mild dementia as well as prostate cancer and they are looking into palliative care at this time. She states she would like Dr. Shlomo to look at echo and send a MyChart message with her interpretation/recommendations.

## 2023-11-20 NOTE — Telephone Encounter (Signed)
-----   Message from Wilbert Bihari sent at 11/20/2023  2:45 PM EDT ----- That would be fine ----- Message ----- From: Janit Geni CROME, RN Sent: 11/20/2023   2:22 PM EDT To: Wilbert JONELLE Bihari, MD  I don't have any open slots for this guy anytime soon I could put him on your DOD day 12/08/23. Is that soon enough? Geni, RN ----- Message ----- From: Memory Nest, RMA Sent: 11/18/2023   2:51 PM EDT To: Geni CROME Janit, RN  Hey, Can we work this pt on TT's schedule in 4-6 weeks?  Needs cath and pt declined today with Glendia and he would like for her to see him.   I will let them know you or someone will call with an appt

## 2023-11-23 NOTE — Telephone Encounter (Signed)
 I have sent MyChart message to patient. Glendia Ferrier, PA-C    11/23/2023 4:52 PM

## 2023-12-08 ENCOUNTER — Ambulatory Visit: Admitting: Cardiology
